# Patient Record
Sex: Female | Born: 1950 | Race: Black or African American | Hispanic: No | Marital: Married | Smoking: Never smoker
Health system: Southern US, Community
[De-identification: ages and names within clinical notes are randomized; demographics above are authoritative.]

## PROBLEM LIST (undated history)

## (undated) DIAGNOSIS — E669 Obesity, unspecified: Secondary | ICD-10-CM

## (undated) DIAGNOSIS — M170 Bilateral primary osteoarthritis of knee: Secondary | ICD-10-CM

## (undated) DIAGNOSIS — E785 Hyperlipidemia, unspecified: Secondary | ICD-10-CM

## (undated) DIAGNOSIS — Z114 Encounter for screening for human immunodeficiency virus [HIV]: Secondary | ICD-10-CM

## (undated) HISTORY — DX: Encounter for screening for human immunodeficiency virus (HIV): Z11.4

## (undated) HISTORY — PX: BUNIONECTOMY: SHX129

## (undated) HISTORY — PX: ABDOMINAL HYSTERECTOMY: SHX81

## (undated) HISTORY — DX: Obesity, unspecified: E66.9

## (undated) HISTORY — DX: Hyperlipidemia, unspecified: E78.5

## (undated) HISTORY — DX: Bilateral primary osteoarthritis of knee: M17.0

---

## 2003-11-11 ENCOUNTER — Ambulatory Visit: Payer: Self-pay | Admitting: Podiatry

## 2004-11-14 ENCOUNTER — Ambulatory Visit: Payer: Self-pay | Admitting: Family Medicine

## 2005-12-11 ENCOUNTER — Ambulatory Visit: Payer: Self-pay | Admitting: Family Medicine

## 2006-01-24 ENCOUNTER — Ambulatory Visit: Payer: Self-pay | Admitting: Gastroenterology

## 2007-03-13 ENCOUNTER — Ambulatory Visit: Payer: Self-pay | Admitting: Family Medicine

## 2008-04-20 ENCOUNTER — Ambulatory Visit: Payer: Self-pay | Admitting: Physician Assistant

## 2009-06-08 ENCOUNTER — Ambulatory Visit: Payer: Self-pay | Admitting: Family Medicine

## 2009-07-12 ENCOUNTER — Ambulatory Visit: Payer: Self-pay | Admitting: Family Medicine

## 2010-06-14 ENCOUNTER — Ambulatory Visit: Payer: Self-pay | Admitting: Family Medicine

## 2011-07-03 ENCOUNTER — Ambulatory Visit: Payer: Self-pay | Admitting: Family Medicine

## 2012-01-31 ENCOUNTER — Ambulatory Visit: Payer: Self-pay | Admitting: Family Medicine

## 2012-08-25 ENCOUNTER — Ambulatory Visit: Payer: Self-pay | Admitting: Family Medicine

## 2013-10-16 ENCOUNTER — Ambulatory Visit: Payer: Self-pay | Admitting: Family Medicine

## 2013-11-10 ENCOUNTER — Ambulatory Visit: Payer: Self-pay | Admitting: Family Medicine

## 2013-11-22 ENCOUNTER — Ambulatory Visit: Payer: Self-pay | Admitting: Family Medicine

## 2013-11-27 ENCOUNTER — Ambulatory Visit: Payer: Self-pay | Admitting: Gastroenterology

## 2014-10-19 ENCOUNTER — Encounter: Payer: Self-pay | Admitting: Family Medicine

## 2014-10-19 ENCOUNTER — Ambulatory Visit (INDEPENDENT_AMBULATORY_CARE_PROVIDER_SITE_OTHER): Payer: BLUE CROSS/BLUE SHIELD | Admitting: Family Medicine

## 2014-10-19 VITALS — BP 112/68 | HR 86 | Temp 98.2°F | Resp 16 | Ht 63.0 in | Wt 169.1 lb

## 2014-10-19 DIAGNOSIS — Z23 Encounter for immunization: Secondary | ICD-10-CM | POA: Diagnosis not present

## 2014-10-19 DIAGNOSIS — Z0000 Encounter for general adult medical examination without abnormal findings: Secondary | ICD-10-CM | POA: Diagnosis not present

## 2014-10-19 NOTE — Addendum Note (Signed)
Addended by: JEFFRIES, Ulla Potash on: 10/19/2014 10:47 AM   Modules accepted: Orders

## 2014-10-19 NOTE — Progress Notes (Signed)
Name: Desiree Carpenter   MRN: [id number]    DOB: 1950/02/14   Date:10/19/2014       Progress Note  Subjective  Chief Complaint  Chief Complaint  Patient presents with  . Annual Exam    HPI  Patient here for annual H&P with no new problems or complaints of significance  History reviewed. No pertinent past medical history.  Social History  Substance Use Topics  . Smoking status: Never Smoker   . Smokeless tobacco: Not on file  . Alcohol Use: No     Current outpatient prescriptions:  .  ibuprofen (ADVIL,MOTRIN) 800 MG tablet, Take 800 mg by mouth 3 (three) times daily., Disp: , Rfl:   No Known Allergies  Review of Systems  Constitutional: Negative for fever, chills and weight loss.       Mild obesity.      HENT: Negative for congestion, hearing loss, sore throat and tinnitus.   Eyes: Negative for blurred vision, double vision and redness.  Respiratory: Negative for cough, hemoptysis and shortness of breath.   Cardiovascular: Negative for chest pain, palpitations, orthopnea, claudication and leg swelling.  Gastrointestinal: Negative for heartburn, nausea, vomiting, diarrhea, constipation and blood in stool.  Genitourinary: Negative for dysuria, urgency, frequency and hematuria.  Musculoskeletal: Negative for myalgias, back pain, joint pain, falls and neck pain.  Skin: Negative for itching.  Neurological: Negative for dizziness, tingling, tremors, focal weakness, seizures, loss of consciousness, weakness and headaches.  Endo/Heme/Allergies: Does not bruise/bleed easily.  Psychiatric/Behavioral: Negative for depression and substance abuse. The patient is not nervous/anxious and does not have insomnia.      Objective  Filed Vitals:   10/19/14 0932  BP: 112/68  Pulse: 86  Temp: 98.2 F (36.8 C)  TempSrc: Oral  Resp: 16  Height: 5\' 3"  (1.6 m)  Weight: 169 lb 1.6 oz (76.703 kg)  SpO2: 95%     Physical Exam  Constitutional: She is oriented to person, place,  and time and well-developed, well-nourished, and in no distress.  Modestly obese  HENT:  Head: Normocephalic.  Eyes: EOM are normal. Pupils are equal, round, and reactive to light.  Neck: Normal range of motion. No thyromegaly present.  Cardiovascular: Normal rate, regular rhythm and normal heart sounds.   No murmur heard. Pulmonary/Chest: Effort normal and breath sounds normal.  Abdominal: Soft. Bowel sounds are normal.  Genitourinary: Vagina normal and left adnexa normal. Guaiac negative stool.  Uterus surgically absent  Musculoskeletal: Normal range of motion. She exhibits no edema.  Neurological: She is alert and oriented to person, place, and time. No cranial nerve deficit. Gait normal.  Skin: Skin is warm and dry. No rash noted.  Psychiatric: Memory and affect normal.      Assessment & Plan  1. Routine medical exam  - POC Hemoccult Bld/Stl (1-Cd Office Dx) - Basic metabolic panel - CBC with Differential - Comprehensive metabolic panel - Lipid panel - TSH

## 2014-11-30 LAB — CBC WITH DIFFERENTIAL/PLATELET
BASOS ABS: 0 10*3/uL (ref 0.0–0.2)
Basos: 0 %
EOS (ABSOLUTE): 0.1 10*3/uL (ref 0.0–0.4)
Eos: 2 %
HEMOGLOBIN: 13.2 g/dL (ref 11.1–15.9)
Hematocrit: 41.2 % (ref 34.0–46.6)
Immature Grans (Abs): 0 10*3/uL (ref 0.0–0.1)
Immature Granulocytes: 0 %
LYMPHS ABS: 1.1 10*3/uL (ref 0.7–3.1)
LYMPHS: 31 %
MCH: 27.5 pg (ref 26.6–33.0)
MCHC: 32 g/dL (ref 31.5–35.7)
MCV: 86 fL (ref 79–97)
MONOCYTES: 9 %
Monocytes Absolute: 0.3 10*3/uL (ref 0.1–0.9)
Neutrophils Absolute: 2.2 10*3/uL (ref 1.4–7.0)
Neutrophils: 58 %
PLATELETS: 201 10*3/uL (ref 150–379)
RBC: 4.8 x10E6/uL (ref 3.77–5.28)
RDW: 13.3 % (ref 12.3–15.4)
WBC: 3.7 10*3/uL (ref 3.4–10.8)

## 2014-11-30 LAB — COMPREHENSIVE METABOLIC PANEL
ALBUMIN: 4.4 g/dL (ref 3.6–4.8)
ALK PHOS: 60 IU/L (ref 39–117)
ALT: 13 IU/L (ref 0–32)
AST: 20 IU/L (ref 0–40)
Albumin/Globulin Ratio: 1.8 (ref 1.1–2.5)
BUN / CREAT RATIO: 20 (ref 11–26)
BUN: 17 mg/dL (ref 8–27)
Bilirubin Total: 0.4 mg/dL (ref 0.0–1.2)
CHLORIDE: 103 mmol/L (ref 97–106)
CO2: 23 mmol/L (ref 18–29)
Calcium: 9.7 mg/dL (ref 8.7–10.3)
Creatinine, Ser: 0.84 mg/dL (ref 0.57–1.00)
GFR calc Af Amer: 86 mL/min/{1.73_m2} (ref >59)
GFR calc non Af Amer: 74 mL/min/{1.73_m2} (ref >59)
GLUCOSE: 88 mg/dL (ref 65–99)
Globulin, Total: 2.5 g/dL (ref 1.5–4.5)
POTASSIUM: 4.9 mmol/L (ref 3.5–5.2)
SODIUM: 142 mmol/L (ref 136–144)
Total Protein: 6.9 g/dL (ref 6.0–8.5)

## 2014-11-30 LAB — LIPID PANEL
CHOLESTEROL TOTAL: 199 mg/dL (ref 100–199)
Chol/HDL Ratio: 2.5 ratio units (ref 0.0–4.4)
HDL: 80 mg/dL (ref >39)
LDL CALC: 107 mg/dL — AB (ref 0–99)
TRIGLYCERIDES: 60 mg/dL (ref 0–149)
VLDL Cholesterol Cal: 12 mg/dL (ref 5–40)

## 2014-11-30 LAB — TSH: TSH: 1.77 u[IU]/mL (ref 0.450–4.500)

## 2015-01-01 ENCOUNTER — Other Ambulatory Visit: Payer: Self-pay | Admitting: Family Medicine

## 2015-04-19 ENCOUNTER — Ambulatory Visit: Payer: BLUE CROSS/BLUE SHIELD | Admitting: Family Medicine

## 2016-03-10 LAB — HEMOGLOBIN A1C: Hemoglobin A1C: 5.5

## 2016-03-26 ENCOUNTER — Encounter: Payer: BLUE CROSS/BLUE SHIELD | Admitting: Family Medicine

## 2016-03-28 ENCOUNTER — Encounter: Payer: Self-pay | Admitting: Family Medicine

## 2016-03-28 ENCOUNTER — Ambulatory Visit (INDEPENDENT_AMBULATORY_CARE_PROVIDER_SITE_OTHER): Payer: BLUE CROSS/BLUE SHIELD | Admitting: Family Medicine

## 2016-03-28 VITALS — BP 122/68 | HR 69 | Temp 98.3°F | Resp 14 | Ht 62.0 in | Wt 178.6 lb

## 2016-03-28 DIAGNOSIS — E669 Obesity, unspecified: Secondary | ICD-10-CM | POA: Insufficient documentation

## 2016-03-28 DIAGNOSIS — Z1159 Encounter for screening for other viral diseases: Secondary | ICD-10-CM

## 2016-03-28 DIAGNOSIS — Z0000 Encounter for general adult medical examination without abnormal findings: Secondary | ICD-10-CM

## 2016-03-28 DIAGNOSIS — Z803 Family history of malignant neoplasm of breast: Secondary | ICD-10-CM | POA: Diagnosis not present

## 2016-03-28 DIAGNOSIS — Z1231 Encounter for screening mammogram for malignant neoplasm of breast: Secondary | ICD-10-CM | POA: Diagnosis not present

## 2016-03-28 DIAGNOSIS — E6609 Other obesity due to excess calories: Secondary | ICD-10-CM

## 2016-03-28 DIAGNOSIS — Z114 Encounter for screening for human immunodeficiency virus [HIV]: Secondary | ICD-10-CM | POA: Diagnosis not present

## 2016-03-28 DIAGNOSIS — Z6832 Body mass index (BMI) 32.0-32.9, adult: Secondary | ICD-10-CM

## 2016-03-28 DIAGNOSIS — Z1239 Encounter for other screening for malignant neoplasm of breast: Secondary | ICD-10-CM

## 2016-03-28 HISTORY — DX: Obesity, unspecified: E66.9

## 2016-03-28 LAB — CBC WITH DIFFERENTIAL/PLATELET
BASOS ABS: 0 {cells}/uL (ref 0–200)
Basophils Relative: 0 %
EOS PCT: 1 %
Eosinophils Absolute: 47 cells/uL (ref 15–500)
HEMATOCRIT: 41.7 % (ref 35.0–45.0)
HEMOGLOBIN: 13.4 g/dL (ref 11.7–15.5)
LYMPHS ABS: 1645 {cells}/uL (ref 850–3900)
Lymphocytes Relative: 35 %
MCH: 27.6 pg (ref 27.0–33.0)
MCHC: 32.1 g/dL (ref 32.0–36.0)
MCV: 86 fL (ref 80.0–100.0)
MONO ABS: 329 {cells}/uL (ref 200–950)
MPV: 12.9 fL — ABNORMAL HIGH (ref 7.5–12.5)
Monocytes Relative: 7 %
NEUTROS PCT: 57 %
Neutro Abs: 2679 cells/uL (ref 1500–7800)
Platelets: 208 10*3/uL (ref 140–400)
RBC: 4.85 MIL/uL (ref 3.80–5.10)
RDW: 13.8 % (ref 11.0–15.0)
WBC: 4.7 10*3/uL (ref 3.8–10.8)

## 2016-03-28 LAB — COMPLETE METABOLIC PANEL WITH GFR
ALBUMIN: 4.5 g/dL (ref 3.6–5.1)
ALK PHOS: 58 U/L (ref 33–130)
ALT: 13 U/L (ref 6–29)
AST: 18 U/L (ref 10–35)
BUN: 15 mg/dL (ref 7–25)
CO2: 27 mmol/L (ref 20–31)
Calcium: 10.2 mg/dL (ref 8.6–10.4)
Chloride: 106 mmol/L (ref 98–110)
Creat: 0.83 mg/dL (ref 0.50–0.99)
GFR, Est African American: 86 mL/min (ref >=60)
GFR, Est Non African American: 75 mL/min (ref >=60)
GLUCOSE: 91 mg/dL (ref 65–99)
POTASSIUM: 4.5 mmol/L (ref 3.5–5.3)
SODIUM: 141 mmol/L (ref 135–146)
Total Bilirubin: 0.4 mg/dL (ref 0.2–1.2)
Total Protein: 7.4 g/dL (ref 6.1–8.1)

## 2016-03-28 NOTE — Assessment & Plan Note (Signed)
Encouraged modest weight loss 

## 2016-03-28 NOTE — Assessment & Plan Note (Signed)
Discussed one-time HIV screening recommendation per USPSTF guidelines; patient agrees with testing; HIV antibody ordered 

## 2016-03-28 NOTE — Assessment & Plan Note (Signed)
Mother and MGM; refer to breast cancer navigator

## 2016-03-28 NOTE — Assessment & Plan Note (Signed)
Discussed one-time hep C screening recommendation for individuals born between 1945-1965 per USPSTF guidelines; patient agrees with testing; Hep C Ab ordered 

## 2016-03-28 NOTE — Patient Instructions (Addendum)
I'll suggest 1,000 iu of vitamin D3 daily I'll suggest a baby 81 mg coated aspirin daily for cardioprotection Recheck fasting labs in 3 months and we'll contact you about the results Check out the information at familydoctor.org entitled "Nutrition for Weight Loss: What You Need to Know about Fad Diets" Try to lose between 1-2 pounds per week by taking in fewer calories and burning off more calories You can succeed by limiting portions, limiting foods dense in calories and fat, becoming more active, and drinking 8 glasses of water a day (64 ounces) Don't skip meals, especially breakfast, as skipping meals may alter your metabolism Do not use over-the-counter weight loss pills or gimmicks that claim rapid weight loss A healthy BMI (or body mass index) is between 18.5 and 24.9 You can calculate your ideal BMI at the NIH website ClubMonetize.fr Try to limit saturated fats in your diet (bologna, hot dogs, barbeque, cheeseburgers, hamburgers, steak, bacon, sausage, cheese, etc.) and get more fresh fruits, vegetables, and whole grains Return for any problems that you have Health Maintenance, Female Adopting a healthy lifestyle and getting preventive care can go a long way to promote health and wellness. Talk with your health care provider about what schedule of regular examinations is right for you. This is a good chance for you to check in with your provider about disease prevention and staying healthy. In between checkups, there are plenty of things you can do on your own. Experts have done a lot of research about which lifestyle changes and preventive measures are most likely to keep you healthy. Ask your health care provider for more information. Weight and diet Eat a healthy diet  Be sure to include plenty of vegetables, fruits, low-fat dairy products, and lean protein.  Do not eat a lot of foods high in solid fats, added sugars, or salt.  Get  regular exercise. This is one of the most important things you can do for your health.  Most adults should exercise for at least 150 minutes each week. The exercise should increase your heart rate and make you sweat (moderate-intensity exercise).  Most adults should also do strengthening exercises at least twice a week. This is in addition to the moderate-intensity exercise. Maintain a healthy weight  Body mass index (BMI) is a measurement that can be used to identify possible weight problems. It estimates body fat based on height and weight. Your health care provider can help determine your BMI and help you achieve or maintain a healthy weight.  For females 42 years of age and older:  A BMI below 18.5 is considered underweight.  A BMI of 18.5 to 24.9 is normal.  A BMI of 25 to 29.9 is considered overweight.  A BMI of 30 and above is considered obese. Watch levels of cholesterol and blood lipids  You should start having your blood tested for lipids and cholesterol at 65 years of age, then have this test every 5 years.  You may need to have your cholesterol levels checked more often if:  Your lipid or cholesterol levels are high.  You are older than 65 years of age.  You are at high risk for heart disease. Cancer screening Lung Cancer  Lung cancer screening is recommended for adults 42-26 years old who are at high risk for lung cancer because of a history of smoking.  A yearly low-dose CT scan of the lungs is recommended for people who:  Currently smoke.  Have quit within the past 15 years.  Have at least a 30-pack-year history of smoking. A pack year is smoking an average of one pack of cigarettes a day for 1 year.  Yearly screening should continue until it has been 15 years since you quit.  Yearly screening should stop if you develop a health problem that would prevent you from having lung cancer treatment. Breast Cancer  Practice breast self-awareness. This means  understanding how your breasts normally appear and feel.  It also means doing regular breast self-exams. Let your health care provider know about any changes, no matter how small.  If you are in your 20s or 30s, you should have a clinical breast exam (CBE) by a health care provider every 1-3 years as part of a regular health exam.  If you are 84 or older, have a CBE every year. Also consider having a breast X-ray (mammogram) every year.  If you have a family history of breast cancer, talk to your health care provider about genetic screening.  If you are at high risk for breast cancer, talk to your health care provider about having an MRI and a mammogram every year.  Breast cancer gene (BRCA) assessment is recommended for women who have family members with BRCA-related cancers. BRCA-related cancers include:  Breast.  Ovarian.  Tubal.  Peritoneal cancers.  Results of the assessment will determine the need for genetic counseling and BRCA1 and BRCA2 testing. Cervical Cancer  Your health care provider may recommend that you be screened regularly for cancer of the pelvic organs (ovaries, uterus, and vagina). This screening involves a pelvic examination, including checking for microscopic changes to the surface of your cervix (Pap test). You may be encouraged to have this screening done every 3 years, beginning at age 42.  For women ages 65-65, health care providers may recommend pelvic exams and Pap testing every 3 years, or they may recommend the Pap and pelvic exam, combined with testing for human papilloma virus (HPV), every 5 years. Some types of HPV increase your risk of cervical cancer. Testing for HPV may also be done on women of any age with unclear Pap test results.  Other health care providers may not recommend any screening for nonpregnant women who are considered low risk for pelvic cancer and who do not have symptoms. Ask your health care provider if a screening pelvic exam is  right for you.  If you have had past treatment for cervical cancer or a condition that could lead to cancer, you need Pap tests and screening for cancer for at least 20 years after your treatment. If Pap tests have been discontinued, your risk factors (such as having a new sexual partner) need to be reassessed to determine if screening should resume. Some women have medical problems that increase the chance of getting cervical cancer. In these cases, your health care provider may recommend more frequent screening and Pap tests. Colorectal Cancer  This type of cancer can be detected and often prevented.  Routine colorectal cancer screening usually begins at 65 years of age and continues through 65 years of age.  Your health care provider may recommend screening at an earlier age if you have risk factors for colon cancer.  Your health care provider may also recommend using home test kits to check for hidden blood in the stool.  A small camera at the end of a tube can be used to examine your colon directly (sigmoidoscopy or colonoscopy). This is done to check for the earliest forms of colorectal cancer.  Routine screening usually begins at age 85.  Direct examination of the colon should be repeated every 5-10 years through 65 years of age. However, you may need to be screened more often if early forms of precancerous polyps or small growths are found. Skin Cancer  Check your skin from head to toe regularly.  Tell your health care provider about any new moles or changes in moles, especially if there is a change in a mole's shape or color.  Also tell your health care provider if you have a mole that is larger than the size of a pencil eraser.  Always use sunscreen. Apply sunscreen liberally and repeatedly throughout the day.  Protect yourself by wearing long sleeves, pants, a wide-brimmed hat, and sunglasses whenever you are outside. Heart disease, diabetes, and high blood pressure  High  blood pressure causes heart disease and increases the risk of stroke. High blood pressure is more likely to develop in:  People who have blood pressure in the high end of the normal range (130-139/85-89 mm Hg).  People who are overweight or obese.  People who are African American.  If you are 24-67 years of age, have your blood pressure checked every 3-5 years. If you are 53 years of age or older, have your blood pressure checked every year. You should have your blood pressure measured twice-once when you are at a hospital or clinic, and once when you are not at a hospital or clinic. Record the average of the two measurements. To check your blood pressure when you are not at a hospital or clinic, you can use:  An automated blood pressure machine at a pharmacy.  A home blood pressure monitor.  If you are between 79 years and 50 years old, ask your health care provider if you should take aspirin to prevent strokes.  Have regular diabetes screenings. This involves taking a blood sample to check your fasting blood sugar level.  If you are at a normal weight and have a low risk for diabetes, have this test once every three years after 65 years of age.  If you are overweight and have a high risk for diabetes, consider being tested at a younger age or more often. Preventing infection Hepatitis B  If you have a higher risk for hepatitis B, you should be screened for this virus. You are considered at high risk for hepatitis B if:  You were born in a country where hepatitis B is common. Ask your health care provider which countries are considered high risk.  Your parents were born in a high-risk country, and you have not been immunized against hepatitis B (hepatitis B vaccine).  You have HIV or AIDS.  You use needles to inject street drugs.  You live with someone who has hepatitis B.  You have had sex with someone who has hepatitis B.  You get hemodialysis treatment.  You take certain  medicines for conditions, including cancer, organ transplantation, and autoimmune conditions. Hepatitis C  Blood testing is recommended for:  Everyone born from [birth year] through 1964.  Anyone with known risk factors for hepatitis C. Sexually transmitted infections (STIs)  You should be screened for sexually transmitted infections (STIs) including gonorrhea and chlamydia if:  You are sexually active and are younger than 65 years of age.  You are older than 66 years of age and your health care provider tells you that you are at risk for this type of infection.  Your sexual activity has changed since you  were last screened and you are at an increased risk for chlamydia or gonorrhea. Ask your health care provider if you are at risk.  If you do not have HIV, but are at risk, it may be recommended that you take a prescription medicine daily to prevent HIV infection. This is called pre-exposure prophylaxis (PrEP). You are considered at risk if:  You are sexually active and do not regularly use condoms or know the HIV status of your partner(s).  You take drugs by injection.  You are sexually active with a partner who has HIV. Talk with your health care provider about whether you are at high risk of being infected with HIV. If you choose to begin PrEP, you should first be tested for HIV. You should then be tested every 3 months for as long as you are taking PrEP. Pregnancy  If you are premenopausal and you may become pregnant, ask your health care provider about preconception counseling.  If you may become pregnant, take 400 to 800 micrograms (mcg) of folic acid every day.  If you want to prevent pregnancy, talk to your health care provider about birth control (contraception). Osteoporosis and menopause  Osteoporosis is a disease in which the bones lose minerals and strength with aging. This can result in serious bone fractures. Your risk for osteoporosis can be identified using a bone density  scan.  If you are 51 years of age or older, or if you are at risk for osteoporosis and fractures, ask your health care provider if you should be screened.  Ask your health care provider whether you should take a calcium or vitamin D supplement to lower your risk for osteoporosis.  Menopause may have certain physical symptoms and risks.  Hormone replacement therapy may reduce some of these symptoms and risks. Talk to your health care provider about whether hormone replacement therapy is right for you. Follow these instructions at home:  Schedule regular health, dental, and eye exams.  Stay current with your immunizations.  Do not use any tobacco products including cigarettes, chewing tobacco, or electronic cigarettes.  If you are pregnant, do not drink alcohol.  If you are breastfeeding, limit how much and how often you drink alcohol.  Limit alcohol intake to no more than 1 drink per day for nonpregnant women. One drink equals 12 ounces of beer, 5 ounces of wine, or 1 ounces of hard liquor.  Do not use street drugs.  Do not share needles.  Ask your health care provider for help if you need support or information about quitting drugs.  Tell your health care provider if you often feel depressed.  Tell your health care provider if you have ever been abused or do not feel safe at home. This information is not intended to replace advice given to you by your health care provider. Make sure you discuss any questions you have with your health care provider. Document Released: 07/24/2010 Document Revised: 06/16/2015 Document Reviewed: 10/12/2014 Elsevier Interactive Patient Education  2017 [employer].

## 2016-03-28 NOTE — Progress Notes (Signed)
Patient ID: Desiree Carpenter, female   DOB: [birth year], 66 y.o.   MRN: [id number]   Subjective:   Desiree Carpenter is a 66 y.o. female here for a complete physical exam  Interim issues since last visit: nothing; light-headed or dizziness that comes and goes; bring her back for that, she would rather do the physical today  USPSTF grade A and B recommendations Alcohol: no Depression: Depression screen [facility] 2/9 03/28/2016 10/19/2014  Decreased Interest 0 0  Down, Depressed, Hopeless 0 0  PHQ - 2 Score 0 0    Hypertension: excellent control; no meds Obesity: discussed; not walking as much as she should; she'll work on that Tobacco use: never HIV, hep B, hep C: discussed, encouraged Lipids: reviewed Glucose: reveiwed, 108 nonfasting, A1c 5.5 Colorectal cancer: 2013; next due this year; no blood in the stool; no night sweats or weight loss Breast cancer: no prior abnormals; mother (survivor) and maternal GM both had breast cancer BRCA gene screening: no ovarian cancer Intimate partner violence: no abuse Cervical cancer screening: s/p hysterectomy; fibroids, no cancer Lung cancer: never smoker Osteoporosis: it's been several years, but not brittle; start at 6 Fall prevention/vitamin D: discussed AAA: no fam hx Aspirin: 6% risk Diet: watching fatty meats; dark greens daily; calcium  Exercise: build gradually Skin cancer: no worrisome moles, nothing changing  Past Medical History:  Diagnosis Date  . Obesity 03/28/2016     Past Surgical History:  Procedure Laterality Date  . ABDOMINAL HYSTERECTOMY    . BUNIONECTOMY     Family History  Problem Relation Age of Onset  . Cancer Mother     breast (50), colon (80)  . Cancer Father 49    colon  . Cancer Maternal Grandmother     breast  . Heart disease Maternal Grandfather     heart failure  . Cancer Maternal Grandfather     lung  . Stroke Paternal Grandmother    Social History  Substance Use Topics  . Smoking status:  Never Smoker  . Smokeless tobacco: Former Neurosurgeon  . Alcohol use No   Review of Systems  Constitutional: Negative for unexpected weight change.  HENT: Negative for hearing loss.   Eyes: Negative for visual disturbance.  Cardiovascular: Negative for chest pain.  Gastrointestinal: Negative for blood in stool.  Endocrine: Negative for polydipsia and polyuria.  Genitourinary: Negative for hematuria.  Musculoskeletal: Positive for arthralgias (hands).  Allergic/Immunologic: Negative for food allergies.  Neurological: Negative for tremors.  Hematological: Does not bruise/bleed easily.  Psychiatric/Behavioral: Negative for dysphoric mood.    Objective:   Vitals:   03/28/16 1147  BP: 122/68  Pulse: 69  Resp: 14  Temp: 98.3 F (36.8 C)  TempSrc: Oral  SpO2: 97%  Weight: 178 lb 9.6 oz (81 kg)  Height: 5\' 2"  (1.575 m)   Body mass index is 32.67 kg/m. Wt Readings from Last 3 Encounters:  03/28/16 178 lb 9.6 oz (81 kg)  10/19/14 169 lb 1.6 oz (76.7 kg)   Physical Exam  Constitutional: She appears well-developed and well-nourished.  HENT:  Head: Normocephalic and atraumatic.  Right Ear: Hearing, tympanic membrane, external ear and ear canal normal.  Left Ear: Hearing, tympanic membrane, external ear and ear canal normal.  Eyes: Conjunctivae and EOM are normal. Right eye exhibits no hordeolum. Left eye exhibits no hordeolum. No scleral icterus.  Neck: No JVD present. No thyromegaly present.  Cardiovascular: Normal rate, regular rhythm, S1 normal, S2 normal and normal heart sounds.   No  extrasystoles are present.  Pulmonary/Chest: Effort normal and breath sounds normal. No respiratory distress. Right breast exhibits no inverted nipple, no mass, no nipple discharge, no skin change and no tenderness. Left breast exhibits no inverted nipple, no mass, no nipple discharge, no skin change and no tenderness. Breasts are symmetrical.  Abdominal: Soft. Normal appearance and bowel sounds are  normal. She exhibits no distension, no abdominal bruit, no pulsatile midline mass and no mass. There is no hepatosplenomegaly. There is no tenderness. No hernia.  Musculoskeletal: Normal range of motion. She exhibits no edema.  Lymphadenopathy:       Head (right side): No submandibular adenopathy present.       Head (left side): No submandibular adenopathy present.    She has no cervical adenopathy.    She has no axillary adenopathy.  Neurological: She is alert. She displays no tremor. No cranial nerve deficit. She exhibits normal muscle tone. Gait normal.  Reflex Scores:      Patellar reflexes are 1+ on the right side and 1+ on the left side. Skin: Skin is warm and dry. No bruising and no ecchymosis noted. No cyanosis. No pallor.  Psychiatric: She has a normal mood and affect. Her speech is normal and behavior is normal. Thought content normal. Her mood appears not anxious. She does not exhibit a depressed mood.   Assessment/Plan:   Problem List Items Addressed This Visit      Other   Preventative health care - Primary    USPSTF grade A and B recommendations reviewed with patient; age-appropriate recommendations, preventive care, screening tests, etc discussed and encouraged; healthy living encouraged; see AVS for patient education given to patient       Relevant Orders   COMPLETE METABOLIC PANEL WITH GFR   CBC with Differential/Platelet   Obesity    Encouraged modest weight loss      Family hx-breast malignancy    Mother and MGM; refer to breast cancer navigator      Relevant Orders   AMB referral to Navigator   Encounter for screening for HIV    Discussed one-time HIV screening recommendation per USPSTF guidelines; patient agrees with testing; HIV antibody ordered       Relevant Orders   HIV antibody   Encounter for hepatitis C screening test for low risk patient    Discussed one-time hep C screening recommendation for individuals born between 1945-1965 per USPSTF  guidelines; patient agrees with testing; Hep C Ab ordered       Relevant Orders   Hepatitis C Antibody    Other Visit Diagnoses    Screening for breast cancer       Relevant Orders   MM Digital Screening      No orders of the defined types were placed in this encounter.  Orders Placed This Encounter  Procedures  . MM Digital Screening    Standing Status:   Future    Standing Expiration Date:   05/28/2017    Order Specific Question:   Reason for Exam (SYMPTOM  OR DIAGNOSIS REQUIRED)    Answer:   screening for breast cancer    Order Specific Question:   Preferred imaging location?    Answer:   [facility]  . HIV antibody  . Hepatitis C Antibody  . COMPLETE METABOLIC PANEL WITH GFR  . CBC with Differential/Platelet  . AMB referral to Navigator    Follow up plan: Return in about 8 months (around 12/03/2016) for Welcome to Medicare visit, 40 minutes.  An After Visit Summary was printed and given to the patient.

## 2016-03-28 NOTE — Assessment & Plan Note (Signed)
USPSTF grade A and B recommendations reviewed with patient; age-appropriate recommendations, preventive care, screening tests, etc discussed and encouraged; healthy living encouraged; see AVS for patient education given to patient  

## 2016-03-29 LAB — HIV ANTIBODY (ROUTINE TESTING W REFLEX): HIV 1&2 Ab, 4th Generation: NONREACTIVE

## 2016-03-29 LAB — HEPATITIS C ANTIBODY: HCV Ab: NEGATIVE

## 2016-04-03 ENCOUNTER — Ambulatory Visit
Admission: RE | Admit: 2016-04-03 | Discharge: 2016-04-03 | Disposition: A | Discharge status: Home / Self Care | Payer: BLUE CROSS/BLUE SHIELD | Source: Ambulatory Visit | Attending: Family Medicine | Admitting: Family Medicine

## 2016-04-03 DIAGNOSIS — Z1239 Encounter for other screening for malignant neoplasm of breast: Secondary | ICD-10-CM

## 2016-04-03 DIAGNOSIS — Z1231 Encounter for screening mammogram for malignant neoplasm of breast: Secondary | ICD-10-CM | POA: Insufficient documentation

## 2016-04-10 ENCOUNTER — Telehealth: Payer: Self-pay | Admitting: Family Medicine

## 2016-04-10 NOTE — Telephone Encounter (Signed)
-----  Message from Theodore Demark, RN sent at 04/10/2016  4:24 PM EDT ----- Regarding: BRCA testing Hi Dr. Sanda Klein, I received your referral about BRCA testing for Ms. Bordenave.  Currently we do not have [employer] here at the [facility], but I talked to [employer] who works for [employer]. Crystal performed genetic testing here in the past.  She said if you would like, she could come by to discuss some options for this patient.  She is very knowledgeable, and helpful with questions related to testing.  Her number is [phone].   I will fax her card, and the NCCN 2018 guidelines for BRCA to you. Thank you, Al Pimple Nurse Navigator

## 2016-04-10 NOTE — Telephone Encounter (Signed)
Can you please pass along this info? Thank you

## 2016-04-11 NOTE — Telephone Encounter (Signed)
Pt informed

## 2016-08-24 ENCOUNTER — Ambulatory Visit (INDEPENDENT_AMBULATORY_CARE_PROVIDER_SITE_OTHER): Payer: BLUE CROSS/BLUE SHIELD | Admitting: Family Medicine

## 2016-08-24 ENCOUNTER — Encounter: Payer: Self-pay | Admitting: Family Medicine

## 2016-08-24 VITALS — BP 118/64 | HR 88 | Temp 98.4°F | Resp 16 | Wt 178.8 lb

## 2016-08-24 DIAGNOSIS — N6321 Unspecified lump in the left breast, upper outer quadrant: Secondary | ICD-10-CM

## 2016-08-24 DIAGNOSIS — N644 Mastodynia: Secondary | ICD-10-CM

## 2016-08-24 NOTE — Progress Notes (Signed)
BP 118/64   Pulse 88   Temp 98.4 F (36.9 C) (Oral)   Resp 16   Wt 178 lb 12.8 oz (81.1 kg)   SpO2 94%   BMI 32.70 kg/m    Subjective:    Patient ID: Desiree Carpenter, female    DOB: Mar 31, 1950, 66 y.o.   MRN: [id number]  HPI: Desiree Carpenter is a 66 y.o. female  Chief Complaint  Patient presents with  . Breast Problem    Left breast numbness and no pain happening for 2-3 weeks    HPI Patient is here for an acute visit She has felt numbness in her LEFT breast for 2-3 weeks No nipple discharge Positive family hx of breast cancer (mother and maternal grandmother) No other symptoms Just concerned and wanted to have her breast checked out  Depression screen [facility] 2/9 08/24/2016 03/28/2016 10/19/2014  Decreased Interest 0 0 0  Down, Depressed, Hopeless 0 0 0  PHQ - 2 Score 0 0 0    Relevant past medical, surgical, family and social history reviewed Past Medical History:  Diagnosis Date  . Obesity 03/28/2016   Past Surgical History:  Procedure Laterality Date  . ABDOMINAL HYSTERECTOMY    . BUNIONECTOMY     Family History  Problem Relation Age of Onset  . Cancer Mother        breast (66), colon (28)  . Breast cancer Mother 76  . Cancer Father 28       colon  . Cancer Maternal Grandmother        breast  . Breast cancer Maternal Grandmother 63  . Heart disease Maternal Grandfather        heart failure  . Cancer Maternal Grandfather        lung  . Stroke Paternal Grandmother    Social History   Social History  . Marital status: Married    Spouse name: N/A  . Number of children: N/A  . Years of education: N/A   Occupational History  . Not on file.   Social History Main Topics  . Smoking status: Never Smoker  . Smokeless tobacco: Never Used  . Alcohol use No  . Drug use: No  . Sexual activity: Not Currently   Other Topics Concern  . Not on file   Social History Narrative  . No narrative on file    Interim medical history since last visit  reviewed. Allergies and medications reviewed  Review of Systems Per HPI unless specifically indicated above     Objective:    BP 118/64   Pulse 88   Temp 98.4 F (36.9 C) (Oral)   Resp 16   Wt 178 lb 12.8 oz (81.1 kg)   SpO2 94%   BMI 32.70 kg/m   Wt Readings from Last 3 Encounters:  08/24/16 178 lb 12.8 oz (81.1 kg)  03/28/16 178 lb 9.6 oz (81 kg)  10/19/14 169 lb 1.6 oz (76.7 kg)    Physical Exam  Constitutional: She appears well-developed and well-nourished.  Cardiovascular: Normal rate.   Pulmonary/Chest: Effort normal. Right breast exhibits no inverted nipple, no mass, no nipple discharge, no skin change and no tenderness. Left breast exhibits mass (fullness in upper outer quadrant LEFT breast). Left breast exhibits no inverted nipple, no nipple discharge, no skin change and no tenderness. Breasts are symmetrical.  Lymphadenopathy:    She has no axillary adenopathy.  Skin:  No skin changes over the breasts  Psychiatric: Her mood appears not anxious.  Results for orders placed or performed in visit on 03/28/16  Hemoglobin A1c  Result Value Ref Range   Hemoglobin A1C 5.5       Assessment & Plan:   Problem List Items Addressed This Visit    None    Visit Diagnoses    Breast pain, left    -  Primary   Relevant Orders   US BREAST LTD UNI LEFT INC AXILLA (Completed)   MM DIAG BREAST TOMO UNI LEFT (Completed)   Breast lump on left side at 1 o'clock position       Relevant Orders   US BREAST LTD UNI LEFT INC AXILLA (Completed)   MM DIAG BREAST TOMO UNI LEFT (Completed)       Follow up plan: No Follow-up on file.  An after-visit summary was printed and given to the patient at [facility].  Please see the patient instructions which may contain other information and recommendations beyond what is mentioned above in the assessment and plan.  Meds ordered this encounter  Medications  . Multiple Vitamin (MULTIVITAMIN) tablet    Sig: Take 1 tablet by mouth  daily.  . cholecalciferol (VITAMIN D) 1000 units tablet    Sig: Take 1,000 Units by mouth once a week.    Orders Placed This Encounter  Procedures  . US BREAST LTD UNI LEFT INC AXILLA  . MM DIAG BREAST TOMO UNI LEFT

## 2016-08-24 NOTE — Patient Instructions (Addendum)
We'll get your breast imaging scheduled If you have not heard anything from my staff in a week about any orders/referrals/studies from today, please contact us here to follow-up [phone] BRCA Gene Testing Why am I having this test? BRCA gene testing is done to check for the presence of harmful changes (mutations) in the BRCA1 gene or the BRCA2 gene (breast cancer susceptibility genes). If there is a mutation, the genes may not be able to help repair damaged cells in the body. As a result, the damaged cells may develop defects that can lead to certain types of cancer. You may have this test if you have a family history of certain types of cancer, including cancer of the:  Breast.  Ovaries.  Fallopian tubes.  Peritoneum.  Pancreas.  Prostate.  What kind of sample is taken? The test requires either a sample of blood or a sample of cells from your saliva. If a sample of blood is needed, it will probably be collected by inserting a needle into a vein. If a sample of saliva is needed, you will get instructions about how to collect the sample. What do the results mean? The test results can show whether you have a mutation in the BRCA1 or BRCA2 gene that increases your risk for certain cancers. Meaning of negative test results A negative test result means that you do not have a mutation in the BRCA1 or BRCA2 gene that is known to increase your risk for certain cancers. This does not mean that you will never get cancer. Talk with your health care provider or a genetic counselor about what this result means for you. Meaning of positive test results A positive test result means that you have a mutation in the BRCA1 or BRCA2 gene that increases your risk for certain cancers. Women with a positive test result have an increased risk for breast and ovarian cancer. Both women and men with a mutation have an increased risk for breast cancer and may be at greater risk for other types of cancer. Getting  a positive test result does not mean that you will develop cancer. Talk with your health care provider or a genetic counselor about what this result means for you. You may be told that you are a carrier. This means that you can pass the mutation to your children. Meaning of ambiguous test results Ambiguous, inconclusive, or uncertain test results mean that there is a change in the BRCA1 or BRCA2 gene, but it is a change that has not been linked to cancer. Talk with your health care provider or a genetic counselor about what this result means for you. Talk with your health care provider to discuss your results, treatment options, and if necessary, the need for more tests. Talk with your health care provider if you have any questions about your results. How do I get my results? It is up to you to get your test results. Ask your health care provider, or the department that is doing the test, when your results will be ready. This information is not intended to replace advice given to you by your health care provider. Make sure you discuss any questions you have with your health care provider. Document Released: 02/02/2004 Document Revised: 09/12/2015 Document Reviewed: 08/31/2015 Elsevier Interactive Patient Education  [employer].

## 2016-08-30 ENCOUNTER — Ambulatory Visit
Admission: RE | Admit: 2016-08-30 | Discharge: 2016-08-30 | Disposition: A | Discharge status: Home / Self Care | Payer: BLUE CROSS/BLUE SHIELD | Source: Ambulatory Visit | Attending: Family Medicine | Admitting: Family Medicine

## 2016-08-30 DIAGNOSIS — N6321 Unspecified lump in the left breast, upper outer quadrant: Secondary | ICD-10-CM | POA: Diagnosis not present

## 2016-08-30 DIAGNOSIS — N644 Mastodynia: Secondary | ICD-10-CM

## 2016-09-03 ENCOUNTER — Ambulatory Visit (INDEPENDENT_AMBULATORY_CARE_PROVIDER_SITE_OTHER): Payer: BLUE CROSS/BLUE SHIELD

## 2016-09-03 DIAGNOSIS — N644 Mastodynia: Secondary | ICD-10-CM | POA: Diagnosis not present

## 2016-11-29 ENCOUNTER — Encounter: Payer: Self-pay | Admitting: Family Medicine

## 2016-11-29 ENCOUNTER — Ambulatory Visit (INDEPENDENT_AMBULATORY_CARE_PROVIDER_SITE_OTHER): Payer: PPO | Admitting: Family Medicine

## 2016-11-29 VITALS — BP 122/62 | HR 73 | Temp 98.1°F | Resp 14 | Ht 63.0 in | Wt 180.4 lb

## 2016-11-29 DIAGNOSIS — Z1211 Encounter for screening for malignant neoplasm of colon: Secondary | ICD-10-CM | POA: Diagnosis not present

## 2016-11-29 DIAGNOSIS — Z789 Other specified health status: Secondary | ICD-10-CM

## 2016-11-29 DIAGNOSIS — Z6831 Body mass index (BMI) 31.0-31.9, adult: Secondary | ICD-10-CM | POA: Diagnosis not present

## 2016-11-29 DIAGNOSIS — Z5181 Encounter for therapeutic drug level monitoring: Secondary | ICD-10-CM | POA: Diagnosis not present

## 2016-11-29 DIAGNOSIS — N951 Menopausal and female climacteric states: Secondary | ICD-10-CM | POA: Insufficient documentation

## 2016-11-29 DIAGNOSIS — Z780 Asymptomatic menopausal state: Secondary | ICD-10-CM | POA: Diagnosis not present

## 2016-11-29 DIAGNOSIS — Z0000 Encounter for general adult medical examination without abnormal findings: Secondary | ICD-10-CM

## 2016-11-29 DIAGNOSIS — E6609 Other obesity due to excess calories: Secondary | ICD-10-CM | POA: Diagnosis not present

## 2016-11-29 MED ORDER — MELOXICAM 15 MG PO TABS: 15.0000 mg | ORAL_TABLET | Freq: Every day | ORAL | 2 refills | Status: DC | PRN

## 2016-11-29 NOTE — Progress Notes (Signed)
Patient: Desiree Carpenter, Female    DOB: 1950/03/18, 66 y.o.   MRN: [id number]  Visit Date: 11/29/2016  Today's Provider: Enid Derry, MD   Chief Complaint  Patient presents with  . Medicare Wellness    welcome    Subjective:    HPI Desiree Carpenter is a 66 y.o. female who presents today for her Subsequent Annual Wellness Visit.  Patient/Caregiver input:  N/a No medical excitement  USPSTF grade A and B recommendations Depression:  Depression screen [facility] 2/9 11/29/2016 08/24/2016 03/28/2016 10/19/2014  Decreased Interest 0 0 0 0  Down, Depressed, Hopeless 0 0 0 0  PHQ - 2 Score 0 0 0 0   Hypertension: good control BP Readings from Last 3 Encounters:  11/29/16 122/62  08/24/16 118/64  03/28/16 122/68   Obesity: she would like to get to 150 pounds Wt Readings from Last 3 Encounters:  11/29/16 180 lb 6.4 oz (81.8 kg)  08/24/16 178 lb 12.8 oz (81.1 kg)  03/28/16 178 lb 9.6 oz (81 kg)   BMI Readings from Last 3 Encounters:  11/29/16 31.96 kg/m  08/24/16 32.70 kg/m  03/28/16 32.67 kg/m    Skin cancer: no worrisome lumps Lung cancer:  Never smoker Breast cancer: next mammo due March 2019 Colorectal cancer: last done Dec 2013; next due this Dec; every five years due to fam hx; no abd pain or bleeding  BRCA gene screening: family hx of breast and/or ovarian cancer and/or metastatic prostate cancer? Mother had breast; MGM breast cancer; no other fam members Cervical cancer screening: s/p hysterectomy for fibroids; no cancer HIV, hep B, hep C: hep C done March 2018 STD testing and prevention (chl/gon/syphilis): not interested Intimate partner violence: no abuse Contraception: n/a Osteoporosis: last DEXA done 2014; due now Fall prevention/vitamin D: discussed  Diet: limiting fatty meats Exercise: will increase gradually, doing that better Alcohol: no Tobacco use: never Aspirin: occasionally Lipids: not needed today Lab Results  Component Value Date   CHOL 199  11/29/2014   Lab Results  Component Value Date   HDL 80 11/29/2014   Lab Results  Component Value Date   LDLCALC 107 (H) 11/29/2014   Lab Results  Component Value Date   TRIG 60 11/29/2014   Lab Results  Component Value Date   CHOLHDL 2.5 11/29/2014   No results found for: LDLDIRECT Glucose:  Glucose  Date Value Ref Range Status  11/29/2014 88 65 - 99 mg/dL Final   Glucose, Bld  Date Value Ref Range Status  03/28/2016 91 65 - 99 mg/dL Final   AAA: no family history  Review of Systems  Constitutional: Negative for unexpected weight change.  HENT: Negative for hearing loss.   Eyes: Negative for visual disturbance.  Gastrointestinal: Negative for blood in stool.  Genitourinary: Negative for hematuria.   Past Medical History:  Diagnosis Date  . Obesity 03/28/2016    Past Surgical History:  Procedure Laterality Date  . ABDOMINAL HYSTERECTOMY    . BUNIONECTOMY      Family History  Problem Relation Age of Onset  . Cancer Mother        breast (75), colon (22)  . Breast cancer Mother 69  . Cancer Father 37       colon  . Cancer Maternal Grandmother        breast  . Breast cancer Maternal Grandmother 23  . Heart disease Maternal Grandfather        heart failure  . Cancer Maternal Grandfather  lung  . Stroke Paternal Grandmother    Social History   Socioeconomic History  . Marital status: Married    Spouse name: Not on file  . Number of children: Not on file  . Years of education: Not on file  . Highest education level: Not on file  Social Needs  . Financial resource strain: Not on file  . Food insecurity - worry: Not on file  . Food insecurity - inability: Not on file  . Transportation needs - medical: Not on file  . Transportation needs - non-medical: Not on file  Occupational History  . Not on file  Tobacco Use  . Smoking status: Never Smoker  . Smokeless tobacco: Never Used  Substance and Sexual Activity  . Alcohol use: No     Alcohol/week: 0.0 oz  . Drug use: No  . Sexual activity: Not Currently  Other Topics Concern  . Not on file  Social History Narrative  . Not on file   Outpatient Encounter Medications as of 11/29/2016  Medication Sig  . cholecalciferol (VITAMIN D) 1000 units tablet Take 1,000 Units by mouth once a week.  . meloxicam (MOBIC) 15 MG tablet Take 1 tablet (15 mg total) daily as needed by mouth. (take at least one hour AFTER aspirin)  . Multiple Vitamin (MULTIVITAMIN) tablet Take 1 tablet by mouth daily.  . [DISCONTINUED] ibuprofen (ADVIL,MOTRIN) 800 MG tablet TAKE 1 TABLET BY MOUTH 3 TIMES A DAY AFTER FINISHING PREDNISONE  . [DISCONTINUED] meloxicam (MOBIC) 15 MG tablet Take 15 mg as needed by mouth.  Marland Kitchen aspirin EC 81 MG tablet Take 1 tablet (81 mg total) daily by mouth.   No facility-administered encounter medications on file as of 11/29/2016.     No Known Allergies  Care Team Updated in EHR: Yes  Last Vision Exam: a year maybe Wears corrective lenses: Yes Last Dental Exam: 6 months ago Last Hearing Exam: n/a Wears Hearing Aids: No  Functional Ability / Safety Screening 1.  Was the timed Get Up and Go test shorter than 30 seconds?  yes 2.  Does the patient need help with the phone, transportation, shopping,      preparing meals, housework, laundry, medications, or managing money?  no 3.  Is the patient's home free of loose throw rugs in walkways, pet beds, electrical cords, etc?   yes      Grab bars in the bathroom? yes      Handrails on the stairs?   yes outside, single level home      Adequate lighting?   yes 4.  Has the patient noticed any hearing difficulties?   no  Advanced Care Planning: A voluntary discussion about advance care planning including the explanation and discussion of advance directives.  Discussed health care proxy and Living will, and the patient was able to identify a health care proxy as Desiree Carpenter, [phone].  Patient does not have a living will at  present time. If patient does have living will, I have requested they bring this to the clinic to be scanned in to their chart. Does patient have a HCPOA?    no Does patient have a living will or MOST form?  no   Objective:   Vitals: BP 122/62   Pulse 73   Temp 98.1 F (36.7 C) (Oral)   Resp 14   Ht _0  (1.6 m)   Wt 180 lb 6.4 oz (81.8 kg)   SpO2 93%   BMI 31.96 kg/m  Body mass  index is 31.96 kg/m.  No exam data present  Physical Exam  Constitutional: She appears well-developed and well-nourished.  HENT:  Head: Normocephalic and atraumatic.  Right Ear: Hearing, tympanic membrane, external ear and ear canal normal.  Left Ear: Hearing, tympanic membrane, external ear and ear canal normal.  Eyes: Conjunctivae and EOM are normal. Right eye exhibits no hordeolum. Left eye exhibits no hordeolum. No scleral icterus.  Neck: Carotid bruit is not present. No thyromegaly present.  Cardiovascular: Normal rate, regular rhythm, S1 normal, S2 normal and normal heart sounds.  No extrasystoles are present.  Pulmonary/Chest: Effort normal and breath sounds normal. No respiratory distress.  Abdominal: Soft. Normal appearance and bowel sounds are normal. She exhibits no distension, no abdominal bruit, no pulsatile midline mass and no mass. There is no hepatosplenomegaly. There is no tenderness. No hernia.  Musculoskeletal: Normal range of motion. She exhibits no edema.  Lymphadenopathy:       Head (right side): No submandibular adenopathy present.       Head (left side): No submandibular adenopathy present.    She has no cervical adenopathy.    She has no axillary adenopathy.  Neurological: She is alert. She displays no tremor. No cranial nerve deficit. She exhibits normal muscle tone. Gait normal.  Reflex Scores:      Patellar reflexes are 2+ on the right side and 2+ on the left side. Skin: Skin is warm and dry. No bruising and no ecchymosis noted. No cyanosis. No pallor.  Psychiatric: Her  speech is normal and behavior is normal. Thought content normal. Her mood appears not anxious. She does not exhibit a depressed mood.   Cognitive Testing - 6-CIT  Correct? Score   What year is it? yes 0 Yes = 0    No = 4  What month is it? yes 0 Yes = 0    No = 3  Remember:     Phillis Knack, [place]     What time is it? yes 0 Yes = 0    No = 3  Count backwards from 20 to 1 yes 0 Correct = 0    1 error = 2   More than 1 error = 4  Say the months of the year in reverse. yes 0 Correct = 0    1 error = 2   More than 1 error = 4  What address did I ask you to remember? noe 0 -->2 Correct = 0  1 error = 2    2 error = 4    3 error = 6    4 error = 8    All wrong = 10       TOTAL SCORE  2/28   Interpretation:  Normal  Normal (0-7) Abnormal (8-28)   Fall Risk: Fall Risk  11/29/2016 08/24/2016 03/28/2016 10/19/2014  Falls in the past year? No No No No  Risk for fall due to : - - - History of fall(s)    Depression Screen Depression screen [facility] 2/9 11/29/2016 08/24/2016 03/28/2016 10/19/2014  Decreased Interest 0 0 0 0  Down, Depressed, Hopeless 0 0 0 0  PHQ - 2 Score 0 0 0 0    No results found for this or any previous visit (from the past 2160 hour(s)).  Assessment & Plan:    There are no diagnoses linked to this encounter.  Exercise Activities and Dietary recommendations Goals    . Weight (lb) < 168 lb (76.2 kg)      -  Discussed health benefits of physical activity, and encouraged her to engage in regular exercise appropriate for her age and condition.   Immunization History  Administered Date(s) Administered  . Influenza,inj,Quad PF,6+ Mos 10/19/2014    Health Maintenance  Topic Date Due  . PNA vac Low Risk Adult (1 of 2 - PCV13) 11/04/2016  . COLONOSCOPY  01/21/2017  . MAMMOGRAM  03/02/2017  . PAP SMEAR  01/23/2019  . TETANUS/TDAP  10/04/2020  . INFLUENZA VACCINE  Completed  . DEXA SCAN  Completed  . Hepatitis C Screening  Completed  . HIV Screening  Completed     Meds ordered this encounter  Medications  . DISCONTD: meloxicam (MOBIC) 15 MG tablet    Sig: Take 15 mg as needed by mouth.  . meloxicam (MOBIC) 15 MG tablet    Sig: Take 1 tablet (15 mg total) daily as needed by mouth. (take at least one hour AFTER aspirin)    Dispense:  30 tablet    Refill:  2  . aspirin EC 81 MG tablet    Sig: Take 1 tablet (81 mg total) daily by mouth.    Current Outpatient Medications:  .  cholecalciferol (VITAMIN D) 1000 units tablet, Take 1,000 Units by mouth once a week., Disp: , Rfl:  .  meloxicam (MOBIC) 15 MG tablet, Take 1 tablet (15 mg total) daily as needed by mouth. (take at least one hour AFTER aspirin), Disp: 30 tablet, Rfl: 2 .  Multiple Vitamin (MULTIVITAMIN) tablet, Take 1 tablet by mouth daily., Disp: , Rfl:  .  aspirin EC 81 MG tablet, Take 1 tablet (81 mg total) daily by mouth., Disp: , Rfl:  Medications Discontinued During This Encounter  Medication Reason  . ibuprofen (ADVIL,MOTRIN) 800 MG tablet Discontinued by provider  . meloxicam (MOBIC) 15 MG tablet Reorder    I have personally reviewed and addressed the Medicare Annual Wellness health risk assessment questionnaire and have noted the following in the patient's chart:  A.         Medical and social history & family history B.         Use of alcohol, tobacco, and illicit drugs  C.         Current medications and supplements D.         Functional and Cognitive ability and status E.         Nutritional status F.         Physical activity G.        Advance directives H.         List of other physicians I.          Hospitalizations, surgeries, and ER visits in previous 12 months J.         [place] such as hearing, vision, cognitive function, and depression  In addition, I have reviewed and discussed with patient certain preventive protocols, quality metrics, and best practice recommendations. A written personalized care plan for preventive services as well as  general preventive health recommendations were provided to patient.  See attached scanned questionnaire for additional information.   Return in about 1 year (around 11/29/2017) for Medicare Wellness check.   Problem List Items Addressed This Visit      Other   Welcome to Medicare preventive visit - Primary    USPSTF grade A and B recommendations reviewed with patient; age-appropriate recommendations, preventive care, screening tests, etc discussed and encouraged; healthy living encouraged;  see AVS for patient education given to patient      Relevant Orders   EKG 12-Lead   Postmenopausal status   Relevant Orders   DG Bone Density   Obesity    Encouraged weight loss      Medication monitoring encounter    Check kidney function      Full code status    Discussed with patient; at this point in her life, she wishes to be a full code; paperwork given to her and she'll complete and may return when finished; her husband will serve as her HCPOA      Relevant Orders   Full code    Other Visit Diagnoses    Screen for colon cancer       Relevant Orders   Ambulatory referral to Gastroenterology

## 2016-11-29 NOTE — Assessment & Plan Note (Signed)
Order DEXA scan 

## 2016-11-29 NOTE — Assessment & Plan Note (Signed)
USPSTF grade A and B recommendations reviewed with patient; age-appropriate recommendations, preventive care, screening tests, etc discussed and encouraged; healthy living encouraged; see AVS for patient education given to patient  

## 2016-11-29 NOTE — Assessment & Plan Note (Signed)
Encouraged weight loss 

## 2016-11-29 NOTE — Assessment & Plan Note (Signed)
Check kidney function 

## 2016-11-29 NOTE — Assessment & Plan Note (Signed)
Discussed with patient; at this point in her life, she wishes to be a full code; paperwork given to her and she'll complete and may return when finished; her husband will serve as her HCPOA

## 2016-11-29 NOTE — Patient Instructions (Addendum)
Our next weight goal for you will be 168 pounds, and then keep going if desired Check out the information at familydoctor.org entitled "Nutrition for Weight Loss: What You Need to Know about Fad Diets" Try to lose between 1-2 pounds per week by taking in fewer calories and burning off more calories You can succeed by limiting portions, limiting foods dense in calories and fat, becoming more active, and drinking 8 glasses of water a day (64 ounces) Don't skip meals, especially breakfast, as skipping meals may alter your metabolism Do not use over-the-counter weight loss pills or gimmicks that claim rapid weight loss A healthy BMI (or body mass index) is between 18.5 and 24.9 You can calculate your ideal BMI at the [employer] website [employer]fr  Health Maintenance  Topic Date Due  . PNA vac Low Risk Adult (1 of 2 - PCV13) 11/04/2016  . COLONOSCOPY  01/21/2017  . MAMMOGRAM  08/30/2017  . PAP SMEAR  01/23/2019  . TETANUS/TDAP  10/04/2020  . INFLUENZA VACCINE  Completed  . DEXA SCAN  Completed  . Hepatitis C Screening  Completed  . HIV Screening  Completed   DEXA is due now Please do call to schedule your bone density study; the number to schedule one at either [facility] or [facility] is [phone] or [phone] Your next mammogram is actually due on or after April 03, 2017 Please have a pneumonia vaccine called a PCV-13 (Prevnar) done on or after December 16, 2016 Then you will need to have another type of pneumonia vaccine at least one year after that, that's called a PPSV-23 (Pneumovax) You can have the new shingles vaccine done at your local pharmacy when they are available; after your first shot, you will need a second shot (booster) between 2 to 6 months after the first shot We'll have you see the specialist for your upcoming colonoscopy  Health Maintenance, Female Adopting a healthy lifestyle and  getting preventive care can go a long way to promote health and wellness. Talk with your health care provider about what schedule of regular examinations is right for you. This is a good chance for you to check in with your provider about disease prevention and staying healthy. In between checkups, there are plenty of things you can do on your own. Experts have done a lot of research about which lifestyle changes and preventive measures are most likely to keep you healthy. Ask your health care provider for more information. Weight and diet Eat a healthy diet  Be sure to include plenty of vegetables, fruits, low-fat dairy products, and lean protein.  Do not eat a lot of foods high in solid fats, added sugars, or salt.  Get regular exercise. This is one of the most important things you can do for your health. ? Most adults should exercise for at least 150 minutes each week. The exercise should increase your heart rate and make you sweat (moderate-intensity exercise). ? Most adults should also do strengthening exercises at least twice a week. This is in addition to the moderate-intensity exercise.  Maintain a healthy weight  Body mass index (BMI) is a measurement that can be used to identify possible weight problems. It estimates body fat based on height and weight. Your health care provider can help determine your BMI and help you achieve or maintain a healthy weight.  For females 90 years of age and older: ? A BMI below 18.5 is considered underweight. ? A BMI of 18.5 to  24.9 is normal. ? A BMI of 25 to 29.9 is considered overweight. ? A BMI of 30 and above is considered obese.  Watch levels of cholesterol and blood lipids  You should start having your blood tested for lipids and cholesterol at 65 years of age, then have this test every 5 years.  You may need to have your cholesterol levels checked more often if: ? Your lipid or cholesterol levels are high. ? You are older than 65 years of  age. ? You are at high risk for heart disease.  Cancer screening Lung Cancer  Lung cancer screening is recommended for adults 76-30 years old who are at high risk for lung cancer because of a history of smoking.  A yearly low-dose CT scan of the lungs is recommended for people who: ? Currently smoke. ? Have quit within the past 15 years. ? Have at least a 30-pack-year history of smoking. A pack year is smoking an average of one pack of cigarettes a day for 1 year.  Yearly screening should continue until it has been 15 years since you quit.  Yearly screening should stop if you develop a health problem that would prevent you from having lung cancer treatment.  Breast Cancer  Practice breast self-awareness. This means understanding how your breasts normally appear and feel.  It also means doing regular breast self-exams. Let your health care provider know about any changes, no matter how small.  If you are in your 20s or 30s, you should have a clinical breast exam (CBE) by a health care provider every 1-3 years as part of a regular health exam.  If you are 15 or older, have a CBE every year. Also consider having a breast X-ray (mammogram) every year.  If you have a family history of breast cancer, talk to your health care provider about genetic screening.  If you are at high risk for breast cancer, talk to your health care provider about having an MRI and a mammogram every year.  Breast cancer gene (BRCA) assessment is recommended for women who have family members with BRCA-related cancers. BRCA-related cancers include: ? Breast. ? Ovarian. ? Tubal. ? Peritoneal cancers.  Results of the assessment will determine the need for genetic counseling and BRCA1 and BRCA2 testing.  Cervical Cancer Your health care provider may recommend that you be screened regularly for cancer of the pelvic organs (ovaries, uterus, and vagina). This screening involves a pelvic examination, including  checking for microscopic changes to the surface of your cervix (Pap test). You may be encouraged to have this screening done every 3 years, beginning at age 44.  For women ages 69-65, health care providers may recommend pelvic exams and Pap testing every 3 years, or they may recommend the Pap and pelvic exam, combined with testing for human papilloma virus (HPV), every 5 years. Some types of HPV increase your risk of cervical cancer. Testing for HPV may also be done on women of any age with unclear Pap test results.  Other health care providers may not recommend any screening for nonpregnant women who are considered low risk for pelvic cancer and who do not have symptoms. Ask your health care provider if a screening pelvic exam is right for you.  If you have had past treatment for cervical cancer or a condition that could lead to cancer, you need Pap tests and screening for cancer for at least 20 years after your treatment. If Pap tests have been discontinued, your risk factors (  such as having a new sexual partner) need to be reassessed to determine if screening should resume. Some women have medical problems that increase the chance of getting cervical cancer. In these cases, your health care provider may recommend more frequent screening and Pap tests.  Colorectal Cancer  This type of cancer can be detected and often prevented.  Routine colorectal cancer screening usually begins at 65 years of age and continues through 65 years of age.  Your health care provider may recommend screening at an earlier age if you have risk factors for colon cancer.  Your health care provider may also recommend using home test kits to check for hidden blood in the stool.  A small camera at the end of a tube can be used to examine your colon directly (sigmoidoscopy or colonoscopy). This is done to check for the earliest forms of colorectal cancer.  Routine screening usually begins at age 2.  Direct examination of  the colon should be repeated every 5-10 years through 65 years of age. However, you may need to be screened more often if early forms of precancerous polyps or small growths are found.  Skin Cancer  Check your skin from head to toe regularly.  Tell your health care provider about any new moles or changes in moles, especially if there is a change in a mole's shape or color.  Also tell your health care provider if you have a mole that is larger than the size of a pencil eraser.  Always use sunscreen. Apply sunscreen liberally and repeatedly throughout the day.  Protect yourself by wearing long sleeves, pants, a wide-brimmed hat, and sunglasses whenever you are outside.  Heart disease, diabetes, and high blood pressure  High blood pressure causes heart disease and increases the risk of stroke. High blood pressure is more likely to develop in: ? People who have blood pressure in the high end of the normal range (130-139/85-89 mm Hg). ? People who are overweight or obese. ? People who are African American.  If you are 65-33 years of age, have your blood pressure checked every 3-5 years. If you are 48 years of age or older, have your blood pressure checked every year. You should have your blood pressure measured twice-once when you are at a hospital or clinic, and once when you are not at a hospital or clinic. Record the average of the two measurements. To check your blood pressure when you are not at a hospital or clinic, you can use: ? An automated blood pressure machine at a pharmacy. ? A home blood pressure monitor.  If you are between 13 years and 6 years old, ask your health care provider if you should take aspirin to prevent strokes.  Have regular diabetes screenings. This involves taking a blood sample to check your fasting blood sugar level. ? If you are at a normal weight and have a low risk for diabetes, have this test once every three years after 65 years of age. ? If you are  overweight and have a high risk for diabetes, consider being tested at a younger age or more often. Preventing infection Hepatitis B  If you have a higher risk for hepatitis B, you should be screened for this virus. You are considered at high risk for hepatitis B if: ? You were born in a country where hepatitis B is common. Ask your health care provider which countries are considered high risk. ? Your parents were born in a high-risk country, and  you have not been immunized against hepatitis B (hepatitis B vaccine). ? You have HIV or AIDS. ? You use needles to inject street drugs. ? You live with someone who has hepatitis B. ? You have had sex with someone who has hepatitis B. ? You get hemodialysis treatment. ? You take certain medicines for conditions, including cancer, organ transplantation, and autoimmune conditions.  Hepatitis C  Blood testing is recommended for: ? Everyone born from [birth year] through 1964. ? Anyone with known risk factors for hepatitis C.  Sexually transmitted infections (STIs)  You should be screened for sexually transmitted infections (STIs) including gonorrhea and chlamydia if: ? You are sexually active and are younger than 65 years of age. ? You are older than 65 years of age and your health care provider tells you that you are at risk for this type of infection. ? Your sexual activity has changed since you were last screened and you are at an increased risk for chlamydia or gonorrhea. Ask your health care provider if you are at risk.  If you do not have HIV, but are at risk, it may be recommended that you take a prescription medicine daily to prevent HIV infection. This is called pre-exposure prophylaxis (PrEP). You are considered at risk if: ? You are sexually active and do not regularly use condoms or know the HIV status of your partner(s). ? You take drugs by injection. ? You are sexually active with a partner who has HIV.  Talk with your health care provider  about whether you are at high risk of being infected with HIV. If you choose to begin PrEP, you should first be tested for HIV. You should then be tested every 3 months for as long as you are taking PrEP. Pregnancy  If you are premenopausal and you may become pregnant, ask your health care provider about preconception counseling.  If you may become pregnant, take 400 to 800 micrograms (mcg) of folic acid every day.  If you want to prevent pregnancy, talk to your health care provider about birth control (contraception). Osteoporosis and menopause  Osteoporosis is a disease in which the bones lose minerals and strength with aging. This can result in serious bone fractures. Your risk for osteoporosis can be identified using a bone density scan.  If you are 32 years of age or older, or if you are at risk for osteoporosis and fractures, ask your health care provider if you should be screened.  Ask your health care provider whether you should take a calcium or vitamin D supplement to lower your risk for osteoporosis.  Menopause may have certain physical symptoms and risks.  Hormone replacement therapy may reduce some of these symptoms and risks. Talk to your health care provider about whether hormone replacement therapy is right for you. Follow these instructions at home:  Schedule regular health, dental, and eye exams.  Stay current with your immunizations.  Do not use any tobacco products including cigarettes, chewing tobacco, or electronic cigarettes.  If you are pregnant, do not drink alcohol.  If you are breastfeeding, limit how much and how often you drink alcohol.  Limit alcohol intake to no more than 1 drink per day for nonpregnant women. One drink equals 12 ounces of beer, 5 ounces of wine, or 1 ounces of hard liquor.  Do not use street drugs.  Do not share needles.  Ask your health care provider for help if you need support or information about quitting drugs.  Tell your  Tell your health care provider if you often feel depressed.  Tell your health care provider if you have ever been abused or do not feel safe at home. This information is not intended to replace advice given to you by your health care provider. Make sure you discuss any questions you have with your health care provider. Document Released: 07/24/2010 Document Revised: 06/16/2015 Document Reviewed: 10/12/2014 Elsevier Interactive Patient Education  2018 Elsevier Inc.  

## 2017-01-11 ENCOUNTER — Other Ambulatory Visit: Payer: Self-pay

## 2017-01-11 MED ORDER — MELOXICAM 15 MG PO TABS: 15.0000 mg | ORAL_TABLET | Freq: Every day | ORAL | 0 refills | Status: AC | PRN

## 2017-01-11 NOTE — Telephone Encounter (Signed)
Incoming request from pharmacy and insurance to change rx for meloxicam to a 90 day supply. Please advise.

## 2017-02-06 DIAGNOSIS — R69 Illness, unspecified: Secondary | ICD-10-CM | POA: Diagnosis not present

## 2017-02-13 ENCOUNTER — Other Ambulatory Visit: Payer: Self-pay

## 2017-02-13 ENCOUNTER — Telehealth: Payer: Self-pay

## 2017-02-13 DIAGNOSIS — Z1211 Encounter for screening for malignant neoplasm of colon: Secondary | ICD-10-CM

## 2017-02-13 NOTE — Telephone Encounter (Signed)
Gastroenterology Pre-Procedure Review  Request Date: 02/28/17 Requesting Physician: Dr. Vicente Males  PATIENT REVIEW QUESTIONS: The patient responded to the following health history questions as indicated:    1. Are you having any GI issues? no 2. Do you have a personal history of Polyps? no 3. Do you have a family history of Colon Cancer or Polyps? yes (mom and dad colon cancer) 4. Diabetes Mellitus? no 5. Joint replacements in the past 12 months?no 6. Major health problems in the past 3 months?no 7. Any artificial heart valves, MVP, or defibrillator?no    MEDICATIONS & ALLERGIES:    Patient reports the following regarding taking any anticoagulation/antiplatelet therapy:   Plavix, Coumadin, Eliquis, Xarelto, Lovenox, Pradaxa, Brilinta, or Effient? no Aspirin? no  Patient confirms/reports the following medications:  Current Outpatient Medications  Medication Sig Dispense Refill  . aspirin EC 81 MG tablet Take 1 tablet (81 mg total) daily by mouth.    . cholecalciferol (VITAMIN D) 1000 units tablet Take 1,000 Units by mouth once a week.    . meloxicam (MOBIC) 15 MG tablet Take 1 tablet (15 mg total) by mouth daily as needed. (take at least one hour AFTER aspirin) 90 tablet 0  . Multiple Vitamin (MULTIVITAMIN) tablet Take 1 tablet by mouth daily.     No current facility-administered medications for this visit.     Patient confirms/reports the following allergies:  No Known Allergies  No orders of the defined types were placed in this encounter.   AUTHORIZATION INFORMATION Primary Insurance: 1D#: Group #:  Secondary Insurance: 1D#: Group #:  SCHEDULE INFORMATION: Date: 02/28/17 Time: Location:[facility]

## 2017-02-25 ENCOUNTER — Other Ambulatory Visit: Payer: Self-pay

## 2017-02-25 ENCOUNTER — Telehealth: Payer: Self-pay | Admitting: Gastroenterology

## 2017-02-25 MED ORDER — PEG 3350-KCL-NABCB-NACL-NASULF 236 G PO SOLR: 4000.0000 mL | Freq: Once | ORAL | 0 refills | Status: AC

## 2017-02-25 NOTE — Telephone Encounter (Signed)
Patient called & l/m on machine stating the bowel prep was to expensive. Is there any other alternatives? Her colonoscopy is scheduled for 02-28-17 .

## 2017-02-25 NOTE — Telephone Encounter (Signed)
PATIENT CALLED BACK & MICHELL STATED SHE WOULD CALL IN GO-[facility] FOR PATIENT TO TAKE 8OZ EVERY 20-30 MINS UNTIL STOOL WAS CLEAR. CALL INTO THE [facility] IN [place].

## 2017-02-25 NOTE — Telephone Encounter (Signed)
Prescription for Golytely has been faxed to [place].

## 2017-02-28 ENCOUNTER — Ambulatory Visit: Payer: Medicare HMO | Admitting: Anesthesiology

## 2017-02-28 ENCOUNTER — Encounter
Admission: RE | Disposition: A | Discharge status: Home / Self Care | Payer: Self-pay | Source: Ambulatory Visit | Attending: Gastroenterology

## 2017-02-28 ENCOUNTER — Encounter: Payer: Self-pay | Admitting: *Deleted

## 2017-02-28 ENCOUNTER — Ambulatory Visit
Admission: RE | Admit: 2017-02-28 | Discharge: 2017-02-28 | Disposition: A | Discharge status: Home / Self Care | Payer: Medicare HMO | Source: Ambulatory Visit | Attending: Gastroenterology | Admitting: Gastroenterology

## 2017-02-28 DIAGNOSIS — Z538 Procedure and treatment not carried out for other reasons: Secondary | ICD-10-CM | POA: Insufficient documentation

## 2017-02-28 DIAGNOSIS — Z1211 Encounter for screening for malignant neoplasm of colon: Secondary | ICD-10-CM | POA: Insufficient documentation

## 2017-02-28 DIAGNOSIS — Z79899 Other long term (current) drug therapy: Secondary | ICD-10-CM | POA: Diagnosis not present

## 2017-02-28 DIAGNOSIS — Z7982 Long term (current) use of aspirin: Secondary | ICD-10-CM | POA: Insufficient documentation

## 2017-02-28 HISTORY — PX: COLONOSCOPY WITH PROPOFOL: SHX5780

## 2017-02-28 SURGERY — COLONOSCOPY WITH PROPOFOL
Anesthesia: General

## 2017-02-28 MED ORDER — PROPOFOL 500 MG/50ML IV EMUL
INTRAVENOUS | Status: AC
Start: 2017-02-28 — End: ?
  Filled 2017-02-28: qty 50

## 2017-02-28 MED ORDER — LACTATED RINGERS IV SOLN
INTRAVENOUS | Status: DC | PRN
  Administered 2017-02-28: 10:00:00 via INTRAVENOUS

## 2017-02-28 MED ORDER — PROPOFOL 10 MG/ML IV BOLUS
INTRAVENOUS | Status: DC | PRN
  Administered 2017-02-28: 100 mg via INTRAVENOUS

## 2017-02-28 MED ORDER — SODIUM CHLORIDE 0.9 % IV SOLN
INTRAVENOUS | Status: DC
  Administered 2017-02-28: 08:00:00 via INTRAVENOUS

## 2017-02-28 MED ORDER — PROPOFOL 500 MG/50ML IV EMUL
INTRAVENOUS | Status: DC | PRN
  Administered 2017-02-28: 140 ug/kg/min via INTRAVENOUS

## 2017-02-28 NOTE — Transfer of Care (Signed)
Immediate Anesthesia Transfer of Care Note  Patient: Desiree Carpenter  Procedure(s) Performed: COLONOSCOPY WITH PROPOFOL (N/A )  Patient Location: Endoscopy Unit  Anesthesia Type:General  Level of Consciousness: awake  Airway & Oxygen Therapy: Patient Spontanous Breathing and Patient connected to nasal cannula oxygen  Post-op Assessment: Report given to RN and Post -op Vital signs reviewed and stable  Post vital signs: Reviewed and stable  Last Vitals:  Vitals:   02/28/17 0810  BP: 128/88  Pulse: (!) 54  Resp: (!) 22  Temp: (!) 36.3 C  SpO2: 100%    Last Pain:  Vitals:   02/28/17 0810  TempSrc: Tympanic         Complications: No apparent anesthesia complications

## 2017-02-28 NOTE — H&P (Signed)
Jonathon Bellows, MD [address] [place], [place], [place], [address] [address], [place], [place], [place], [address] Phone: [phone]  Fax: [phone]  Primary Care Physician:  Arnetha Courser, MD   Pre-Procedure History & Physical: HPI:  Desiree Carpenter is a 67 y.o. female is here for an colonoscopy.   Past Medical History:  Diagnosis Date  . Obesity 03/28/2016    Past Surgical History:  Procedure Laterality Date  . ABDOMINAL HYSTERECTOMY    . BUNIONECTOMY      Prior to Admission medications   Medication Sig Start Date End Date Taking? Authorizing Provider  Multiple Vitamin (MULTIVITAMIN) tablet Take 1 tablet by mouth daily.   Yes [provider]  aspirin EC 81 MG tablet Take 1 tablet (81 mg total) daily by mouth. 11/29/16   Arnetha Courser, MD  cholecalciferol (VITAMIN D) 1000 units tablet Take 1,000 Units by mouth once a week.    [provider]  meloxicam (MOBIC) 15 MG tablet Take 1 tablet (15 mg total) by mouth daily as needed. (take at least one hour AFTER aspirin) 01/11/17 01/11/18  Arnetha Courser, MD    Allergies as of 02/13/2017  . (No Known Allergies)    Family History  Problem Relation Age of Onset  . Cancer Mother        breast (61), colon (28)  . Breast cancer Mother 4  . Cancer Father 30       colon  . Cancer Maternal Grandmother        breast  . Breast cancer Maternal Grandmother 3  . Heart disease Maternal Grandfather        heart failure  . Cancer Maternal Grandfather        lung  . Stroke Paternal Grandmother     Social History   Socioeconomic History  . Marital status: Married    Spouse name: Not on file  . Number of children: Not on file  . Years of education: Not on file  . Highest education level: Not on file  Social Needs  . Financial resource strain: Not on file  . Food insecurity - worry: Not on file  . Food insecurity - inability: Not on file  . Transportation needs - medical: Not on file    . Transportation needs - non-medical: Not on file  Occupational History  . Not on file  Tobacco Use  . Smoking status: Never Smoker  . Smokeless tobacco: Never Used  Substance and Sexual Activity  . Alcohol use: No    Alcohol/week: 0.0 oz  . Drug use: No  . Sexual activity: Not Currently  Other Topics Concern  . Not on file  Social History Narrative  . Not on file    Review of Systems: See HPI, otherwise negative ROS  Physical Exam: BP 128/88   Pulse (!) 54   Temp (!) 97.4 F (36.3 C) (Tympanic)   Resp (!) 22   Ht 5\' 3"  (1.6 m)   Wt 180 lb (81.6 kg)   SpO2 100%   BMI 31.89 kg/m  General:   Alert,  pleasant and cooperative in NAD Head:  Normocephalic and atraumatic. Neck:  Supple; no masses or thyromegaly. Lungs:  Clear throughout to auscultation, normal respiratory effort.    Heart:  +S1, +S2, Regular rate and rhythm, No edema. Abdomen:  Soft, nontender and nondistended. Normal bowel sounds, without guarding, and without rebound.   Neurologic:  Alert and  oriented x4;  grossly normal neurologically.  Impression/Plan: Desiree Carpenter is here for an colonoscopy to be performed for Screening colonoscopy with family history of colon cancer. Risks, benefits, limitations, and alternatives regarding  colonoscopy have been reviewed with the patient.  Questions have been answered.  All parties agreeable.   Jonathon Bellows, MD  02/28/2017, 10:19 AM

## 2017-02-28 NOTE — Anesthesia Preprocedure Evaluation (Signed)
Anesthesia Evaluation  Patient identified by MRN, date of birth, ID band Patient awake    Reviewed: Allergy & Precautions, H&P , NPO status , Patient's Chart, lab work & pertinent test results  History of Anesthesia Complications Negative for: history of anesthetic complications  Airway Mallampati: III  TM Distance: >3 FB Neck ROM: limited    Dental  (+) Chipped   Pulmonary neg pulmonary ROS, neg shortness of breath,           Cardiovascular Exercise Tolerance: Good (-) angina(-) Past MI and (-) DOE negative cardio ROS       Neuro/Psych negative neurological ROS  negative psych ROS   GI/Hepatic negative GI ROS, Neg liver ROS, neg GERD  ,  Endo/Other  negative endocrine ROS  Renal/GU negative Renal ROS  negative genitourinary   Musculoskeletal   Abdominal   Peds  Hematology negative hematology ROS (+)   Anesthesia Other Findings Past Medical History: 03/28/2016: Obesity  Past Surgical History: No date: ABDOMINAL HYSTERECTOMY No date: BUNIONECTOMY  BMI    Body Mass Index:  31.89 kg/m      Reproductive/Obstetrics negative OB ROS                             Anesthesia Physical Anesthesia Plan  ASA: II  Anesthesia Plan: General   Post-op Pain Management:    Induction: Intravenous  PONV Risk Score and Plan: Propofol infusion and TIVA  Airway Management Planned: Natural Airway and Nasal Cannula  Additional Equipment:   Intra-op Plan:   Post-operative Plan:   Informed Consent: I have reviewed the patients History and Physical, chart, labs and discussed the procedure including the risks, benefits and alternatives for the proposed anesthesia with the patient or authorized representative who has indicated his/her understanding and acceptance.   Dental Advisory Given  Plan Discussed with: Anesthesiologist, CRNA and Surgeon  Anesthesia Plan Comments: (Patient consented for  risks of anesthesia including but not limited to:  - adverse reactions to medications - risk of intubation if required - damage to teeth, lips or other oral mucosa - sore throat or hoarseness - Damage to heart, brain, lungs or loss of life  Patient voiced understanding.)        Anesthesia Quick Evaluation

## 2017-02-28 NOTE — Op Note (Signed)
[facility] Patient Name: Desiree Carpenter Procedure Date: 02/28/2017 10:16 AM MRN: [id number] Account #: [id number] Date of Birth: January 07, 1951 Admit Type: Outpatient Age: 67 Room: [facility] ENDO ROOM 4 Gender: Female Note Status: Finalized Procedure:            Colonoscopy Providers:            Jonathon Bellows MD, MD Referring MD:         Arnetha Courser (Referring MD) Medicines:            Monitored Anesthesia Care Complications:        No immediate complications. Procedure:            Pre-Anesthesia Assessment:                       - Prior to the procedure, a History and Physical was                        performed, and patient medications, allergies and                        sensitivities were reviewed. The patient's tolerance of                        previous anesthesia was reviewed.                       - The risks and benefits of the procedure and the                        sedation options and risks were discussed with the                        patient. All questions were answered and informed                        consent was obtained.                       - ASA Grade Assessment: II - A patient with mild                        systemic disease.                       After obtaining informed consent, the colonoscope was                        passed under direct vision. Throughout the procedure,                        the patient's blood pressure, pulse, and oxygen                        saturations were monitored continuously. The                        Colonoscope was introduced through the anus with the                        intention of advancing to the cecum. The scope was  advanced to the sigmoid colon before the procedure was                        aborted. Medications were given. The colonoscopy was                        performed with ease. The patient tolerated the                        procedure well. The  quality of the bowel preparation                        was 100 percent obscured. The colonoscopy was aborted                        due to inadequate bowel prep. The colonoscopy was                        performed with ease. The patient tolerated the                        procedure well. Findings:      Solid stool was found in the sigmoid colon, making visualization       difficult. Impression:           - The procedure was aborted due to inadequate bowel                        prep.                       - Stool in the sigmoid colon.                       - No specimens collected. Recommendation:       - Discharge patient to home (with escort).                       - Resume previous diet.                       - Continue present medications.                       - Repeat colonoscopy in 2 weeks because the bowel                        preparation was suboptimal. Procedure Code(s):    --- Professional ---                       [id number], 53, Colonoscopy, flexible; diagnostic, including                        collection of specimen(s) by brushing or washing, when                        performed (separate procedure) Diagnosis Code(s):    --- Professional ---                       Z53.8, Procedure and treatment not carried out for  other reasons CPT copyright 2016 American Medical Association. All rights reserved. The codes documented in this report are preliminary and upon coder review may  be revised to meet current compliance requirements. Jonathon Bellows, MD Jonathon Bellows MD, MD 02/28/2017 10:36:47 AM This report has been signed electronically. Number of Addenda: 0 Note Initiated On: 02/28/2017 10:16 AM Total Procedure Duration: 0 hours 1 minute 0 seconds       [facility]

## 2017-02-28 NOTE — Progress Notes (Signed)
Colonoscopy aborted due to stool present in colon

## 2017-02-28 NOTE — Anesthesia Post-op Follow-up Note (Signed)
Anesthesia QCDR form completed.        

## 2017-02-28 NOTE — Anesthesia Postprocedure Evaluation (Signed)
Anesthesia Post Note  Patient: Desiree Carpenter  Procedure(s) Performed: COLONOSCOPY WITH PROPOFOL (N/A )  Patient location during evaluation: Endoscopy Anesthesia Type: General Level of consciousness: awake and alert Pain management: pain level controlled Vital Signs Assessment: post-procedure vital signs reviewed and stable Respiratory status: spontaneous breathing, nonlabored ventilation, respiratory function stable and patient connected to nasal cannula oxygen Cardiovascular status: blood pressure returned to baseline and stable Postop Assessment: no apparent nausea or vomiting Anesthetic complications: no     Last Vitals:  Vitals:   02/28/17 1050 02/28/17 1100  BP: 122/71 112/86  Pulse: (!) 49 (!) 52  Resp: 15 10  Temp:    SpO2: 99% 100%    Last Pain:  Vitals:   02/28/17 1038  TempSrc: Tympanic                 Precious Haws Shakir Petrosino

## 2017-03-11 NOTE — Progress Notes (Signed)
Signing off on abstraction from March 2018

## 2017-03-19 ENCOUNTER — Telehealth: Payer: Self-pay | Admitting: Gastroenterology

## 2017-03-19 NOTE — Telephone Encounter (Signed)
Patient  LVM that she needs to r/s her procedure.

## 2017-03-19 NOTE — Telephone Encounter (Signed)
Patient would like to check with her insurance company to see if her repeat colonoscopy due to poor bowel prep will be covered.  I advised her to check with her insurance , however I did not think repeat due to poor bowel prep is covered.  She will check with her insurance company and call back to schedule.

## 2017-03-25 DIAGNOSIS — R69 Illness, unspecified: Secondary | ICD-10-CM | POA: Diagnosis not present

## 2017-03-26 DIAGNOSIS — Z823 Family history of stroke: Secondary | ICD-10-CM | POA: Diagnosis not present

## 2017-03-26 DIAGNOSIS — J309 Allergic rhinitis, unspecified: Secondary | ICD-10-CM | POA: Diagnosis not present

## 2017-03-26 DIAGNOSIS — G8929 Other chronic pain: Secondary | ICD-10-CM | POA: Diagnosis not present

## 2017-03-26 DIAGNOSIS — Z809 Family history of malignant neoplasm, unspecified: Secondary | ICD-10-CM | POA: Diagnosis not present

## 2017-03-26 DIAGNOSIS — Z791 Long term (current) use of non-steroidal anti-inflammatories (NSAID): Secondary | ICD-10-CM | POA: Diagnosis not present

## 2017-03-26 DIAGNOSIS — Z6832 Body mass index (BMI) 32.0-32.9, adult: Secondary | ICD-10-CM | POA: Diagnosis not present

## 2017-03-26 DIAGNOSIS — Z803 Family history of malignant neoplasm of breast: Secondary | ICD-10-CM | POA: Diagnosis not present

## 2017-03-26 DIAGNOSIS — Z833 Family history of diabetes mellitus: Secondary | ICD-10-CM | POA: Diagnosis not present

## 2017-03-26 DIAGNOSIS — E669 Obesity, unspecified: Secondary | ICD-10-CM | POA: Diagnosis not present

## 2017-04-04 DIAGNOSIS — R69 Illness, unspecified: Secondary | ICD-10-CM | POA: Diagnosis not present

## 2017-05-01 ENCOUNTER — Telehealth: Payer: Self-pay | Admitting: Family Medicine

## 2017-05-01 DIAGNOSIS — Z1239 Encounter for other screening for malignant neoplasm of breast: Secondary | ICD-10-CM

## 2017-05-01 NOTE — Telephone Encounter (Signed)
Please recommend that patient get her PCV-13 immunization soon; we discussed this at her Welcome to Medicare visit last fall She's also overdue for her mammogram; please order and have her schedule Thank you

## 2017-05-02 NOTE — Telephone Encounter (Signed)
Left pt a detailed voicemail 

## 2017-05-06 ENCOUNTER — Other Ambulatory Visit: Payer: Self-pay | Admitting: Family Medicine

## 2017-05-06 ENCOUNTER — Ambulatory Visit (INDEPENDENT_AMBULATORY_CARE_PROVIDER_SITE_OTHER): Payer: Medicare HMO

## 2017-05-06 DIAGNOSIS — Z23 Encounter for immunization: Secondary | ICD-10-CM

## 2017-05-06 NOTE — Progress Notes (Signed)
PCV13 ordered

## 2017-05-07 DIAGNOSIS — Z23 Encounter for immunization: Secondary | ICD-10-CM | POA: Insufficient documentation

## 2017-05-08 DIAGNOSIS — Z20828 Contact with and (suspected) exposure to other viral communicable diseases: Secondary | ICD-10-CM | POA: Diagnosis not present

## 2017-05-08 DIAGNOSIS — R52 Pain, unspecified: Secondary | ICD-10-CM | POA: Diagnosis not present

## 2017-05-08 DIAGNOSIS — R05 Cough: Secondary | ICD-10-CM | POA: Diagnosis not present

## 2017-05-08 DIAGNOSIS — J019 Acute sinusitis, unspecified: Secondary | ICD-10-CM | POA: Diagnosis not present

## 2017-08-12 ENCOUNTER — Ambulatory Visit: Payer: Self-pay

## 2017-08-12 NOTE — Telephone Encounter (Signed)
Patient called in with c/o "ankle swelling." She says "my ankles are puffy and it started swelling yesterday. I can still wear shoes. No pain." I asked about other symptoms, she denies. According to protocol, see PCP within 3 days, appointment already scheduled for tomorrow at 1340 with Dr. Sanda Klein, care advice given, patient verbalized understanding.  Reason for Disposition . Swollen ankle joint  (Exception: area of localized swelling which is itchy)  Answer Assessment - Initial Assessment Questions 1. LOCATION: "Which joint is swollen?"     Both ankles and feet 2. ONSET: "When did the swelling start?"     Yesterday 3. SIZE: "How large is the swelling?"     Puffy 4. PAIN: "Is there any pain?" If so, ask: "How bad is it?" (Scale 1-10; or mild, moderate, severe)     No 5. CAUSE: "What do you think caused the swollen joint?"     I don't know 6. OTHER SYMPTOMS: "Do you have any other symptoms?" (e.g., fever, chest pain, difficulty breathing, calf pain)     No 7. PREGNANCY: "Is there any chance you are pregnant?" "When was your last menstrual period?"     No  Protocols used: ANKLE SWELLING-A-AH

## 2017-08-13 ENCOUNTER — Ambulatory Visit (INDEPENDENT_AMBULATORY_CARE_PROVIDER_SITE_OTHER): Payer: Medicare HMO | Admitting: Family Medicine

## 2017-08-13 ENCOUNTER — Encounter (INDEPENDENT_AMBULATORY_CARE_PROVIDER_SITE_OTHER): Payer: Self-pay

## 2017-08-13 ENCOUNTER — Encounter: Payer: Self-pay | Admitting: Family Medicine

## 2017-08-13 VITALS — BP 124/64 | HR 71 | Temp 98.1°F | Resp 12 | Ht 63.0 in | Wt 186.4 lb

## 2017-08-13 DIAGNOSIS — R69 Illness, unspecified: Secondary | ICD-10-CM | POA: Diagnosis not present

## 2017-08-13 DIAGNOSIS — M654 Radial styloid tenosynovitis [de Quervain]: Secondary | ICD-10-CM | POA: Diagnosis not present

## 2017-08-13 DIAGNOSIS — I872 Venous insufficiency (chronic) (peripheral): Secondary | ICD-10-CM

## 2017-08-13 MED ORDER — WRIST BRACE/LEFT MEDIUM MISC: 0 refills | Status: DC

## 2017-08-13 NOTE — Patient Instructions (Addendum)
Use a thumb spica splint for much of the day You can use the meloxicam once a day De Quervain Tenosynovitis Tendons attach muscles to bones. They also help with joint movements. When tendons become irritated or swollen, it is called tendinitis. The extensor pollicis brevis (EPB) tendon connects the EPB muscle to a bone that is near the base of the thumb. The EPB muscle helps to straighten and extend the thumb. De Quervain tenosynovitis is a condition in which the EPB tendon lining (sheath) becomes irritated, thickened, and swollen. This condition is sometimes called stenosing tenosynovitis. This condition causes pain on the thumb side of the back of the wrist. What are the causes? Causes of this condition include:  Activities that repeatedly cause your thumb and wrist to extend.  A sudden increase in activity or change in activity that affects your wrist.  What increases the risk? This condition is more likely to develop in:  Females.  People who have diabetes.  Women who have recently given birth.  People who are over 8 years of age.  People who do activities that involve repeated hand and wrist motions, such as tennis, racquetball, volleyball, gardening, and taking care of children.  People who do heavy labor.  People who have poor wrist strength and flexibility.  People who do not warm up properly before activities.  What are the signs or symptoms? Symptoms of this condition include:  Pain or tenderness over the thumb side of the back of the wrist when your thumb and wrist are not moving.  Pain that gets worse when you straighten your thumb or extend your thumb or wrist.  Pain when the injured area is touched.  Locking or catching of the thumb joint while you bend and straighten your thumb.  Decreased thumb motion due to pain.  Swelling over the affected area.  How is this diagnosed? This condition is diagnosed with a medical history and physical exam. Your health  care provider will ask for details about your injury and ask about your symptoms. How is this treated? Treatment may include the use of icing and medicines to reduce pain and swelling. You may also be advised to wear a splint or brace to limit your thumb and wrist motion. In less severe cases, treatment may also include working with a physical therapist to strengthen your wrist and calm the irritation around your EPB tendon sheath. In severe cases, surgery may be needed. Follow these instructions at home: If you have a splint or brace:  Wear it as told by your health care provider. Remove it only as told by your health care provider.  Loosen the splint or brace if your fingers become numb and tingle, or if they turn cold and blue.  Keep the splint or brace clean and dry. Managing pain, stiffness, and swelling  If directed, apply ice to the injured area. ? Put ice in a plastic bag. ? Place a towel between your skin and the bag. ? Leave the ice on for 20 minutes, 2-3 times per day.  Move your fingers often to avoid stiffness and to lessen swelling.  Raise (elevate) the injured area above the level of your heart while you are sitting or lying down. General instructions  Return to your normal activities as told by your health care provider. Ask your health care provider what activities are safe for you.  Take over-the-counter and prescription medicines only as told by your health care provider.  Keep all follow-up visits as told  by your health care provider. This is important.  Do not drive or operate heavy machinery while taking prescription pain medicine. Contact a health care provider if:  Your pain, tenderness, or swelling gets worse, even if you have had treatment.  You have numbness or tingling in your wrist, hand, or fingers on the injured side. This information is not intended to replace advice given to you by your health care provider. Make sure you discuss any questions you  have with your health care provider. Document Released: 01/08/2005 Document Revised: 06/16/2015 Document Reviewed: 03/16/2014 Elsevier Interactive Patient Education  [employer].

## 2017-08-13 NOTE — Progress Notes (Signed)
BP 124/64   Pulse 71   Temp 98.1 F (36.7 C) (Oral)   Resp 12   Ht 5\' 3"  (1.6 m)   Wt 186 lb 6.4 oz (84.6 kg)   SpO2 97%   BMI 33.02 kg/m    Subjective:    Patient ID: Desiree Carpenter, female    DOB: 1950/09/22, 67 y.o.   MRN: [id number]  HPI: Desiree Carpenter is a 67 y.o. female  Chief Complaint  Patient presents with  . Edema    left wrist and bilateral feet and ankes    HPI  Left wrist pain  States pain in left wrist and swelling for about 3 weeks. Pain is the same- constant and worse with movements. Described as achey pain- mild. Denies injuries or weakness. Pain runs down to thumb. Hasn't done anything for the pain. She is right-handed. Helped mother move recently. Has not tried heat or ice; nothing similar; she has some meloxicam at home, left over; no blood in the stool, no stomach ulcer  Bilateral Ankles Swelling Bilateral ankle swelling started 3 days ago. States has done a lot of traveling in the past few weeks and has been on her feet or sitting a lot. States swelling decreased yesterday after elevation.  Denies shortness of breath, chest pain, redness in legs, leg pain. Tries to avoid salt and doesn't add additional salt. Drinks a good amount of water- at least 10 glasses a day.   Depression screen [facility] 2/9 08/13/2017 11/29/2016 08/24/2016 03/28/2016 10/19/2014  Decreased Interest 0 0 0 0 0  Down, Depressed, Hopeless 0 0 0 0 0  PHQ - 2 Score 0 0 0 0 0    Relevant past medical, surgical, family and social history reviewed Past Medical History:  Diagnosis Date  . Obesity 03/28/2016   Past Surgical History:  Procedure Laterality Date  . ABDOMINAL HYSTERECTOMY    . BUNIONECTOMY    . COLONOSCOPY WITH PROPOFOL N/A 02/28/2017   Procedure: COLONOSCOPY WITH PROPOFOL;  Surgeon: Jonathon Bellows, MD;  Location: [facility] ENDOSCOPY;  Service: Gastroenterology;  Laterality: N/A;   Family History  Problem Relation Age of Onset  . Cancer Mother        breast (74), colon (8)  .  Breast cancer Mother 27  . Cancer Father 6       colon  . Cancer Maternal Grandmother        breast  . Breast cancer Maternal Grandmother 32  . Heart disease Maternal Grandfather        heart failure  . Cancer Maternal Grandfather        lung  . Stroke Paternal Grandmother    Social History   Tobacco Use  . Smoking status: Never Smoker  . Smokeless tobacco: Never Used  Substance Use Topics  . Alcohol use: No    Alcohol/week: 0.0 oz  . Drug use: No    Interim medical history since last visit reviewed. Allergies and medications reviewed  Review of Systems Per HPI unless specifically indicated above     Objective:    BP 124/64   Pulse 71   Temp 98.1 F (36.7 C) (Oral)   Resp 12   Ht 5\' 3"  (1.6 m)   Wt 186 lb 6.4 oz (84.6 kg)   SpO2 97%   BMI 33.02 kg/m   Wt Readings from Last 3 Encounters:  08/13/17 186 lb 6.4 oz (84.6 kg)  02/28/17 180 lb (81.6 kg)  11/29/16 180 lb 6.4 oz (81.8  kg)    Physical Exam  Constitutional: She appears well-developed and well-nourished.  HENT:  Mouth/Throat: Mucous membranes are normal.  Eyes: EOM are normal. No scleral icterus.  Cardiovascular: Normal rate and regular rhythm.  Pulmonary/Chest: Effort normal and breath sounds normal.  Musculoskeletal: She exhibits no edema (no visible edema today).       Left wrist: She exhibits tenderness and swelling. She exhibits no deformity.       Arms: No calf swelling, erythema, increased temp to touch  Psychiatric: She has a normal mood and affect. Her behavior is normal. Her mood appears not anxious.    Results for orders placed or performed in visit on 03/28/16  Hemoglobin A1c  Result Value Ref Range   Hemoglobin A1C 5.5       Assessment & Plan:   Problem List Items Addressed This Visit    None    Visit Diagnoses    De Quervain's tenosynovitis, left    -  Primary   discussed dx, treatment options; brace, avoid repetitive movements; see AVS   Venous insufficiency of both lower  extremities       suspect mild venous insufficiency; nothing to suggest DVT       Follow up plan: No follow-ups on file.  An after-visit summary was printed and given to the patient at [place].  Please see the patient instructions which may contain other information and recommendations beyond what is mentioned above in the assessment and plan.  Meds ordered this encounter  Medications  . Elastic Bandages & Supports (WRIST BRACE/LEFT MEDIUM) MISC    Sig: Thumb spica splint for dequervains; NOT carpal tunnel; left wrist/hand    Dispense:  1 each    Refill:  0    No orders of the defined types were placed in this encounter.

## 2017-09-18 ENCOUNTER — Ambulatory Visit
Admission: RE | Admit: 2017-09-18 | Discharge: 2017-09-18 | Disposition: A | Discharge status: Home / Self Care | Payer: Medicare HMO | Source: Ambulatory Visit | Attending: Family Medicine | Admitting: Family Medicine

## 2017-09-18 DIAGNOSIS — Z1231 Encounter for screening mammogram for malignant neoplasm of breast: Secondary | ICD-10-CM | POA: Insufficient documentation

## 2017-09-18 DIAGNOSIS — Z1239 Encounter for other screening for malignant neoplasm of breast: Secondary | ICD-10-CM

## 2017-10-11 DIAGNOSIS — R69 Illness, unspecified: Secondary | ICD-10-CM | POA: Diagnosis not present

## 2017-12-02 ENCOUNTER — Ambulatory Visit: Payer: PPO | Admitting: Family Medicine

## 2017-12-05 ENCOUNTER — Ambulatory Visit: Payer: Medicare HMO

## 2017-12-10 ENCOUNTER — Telehealth: Payer: Self-pay

## 2017-12-10 DIAGNOSIS — M654 Radial styloid tenosynovitis [de Quervain]: Secondary | ICD-10-CM

## 2017-12-10 NOTE — Telephone Encounter (Signed)
If this is for the [employer] that I saw her for in July, then yes that's fine and I put in a referral to orthopaedics just now If it is for a different problem, then it's fine to enter new referral with her current symptoms

## 2017-12-10 NOTE — Telephone Encounter (Signed)
Copied from [place] [id number]. Topic: General - Other >> Dec 10, 2017  3:37 PM Keene Breath wrote: Reason for CRM: Patient called to ask the doctor about a cortisone shot or if she could refer her to someone that could give the shot.  Patient stated that her finger has not gotten better and she needs some relieve.

## 2017-12-10 NOTE — Telephone Encounter (Signed)
Pt notified same problem

## 2017-12-17 DIAGNOSIS — R69 Illness, unspecified: Secondary | ICD-10-CM | POA: Diagnosis not present

## 2017-12-17 DIAGNOSIS — M654 Radial styloid tenosynovitis [de Quervain]: Secondary | ICD-10-CM | POA: Diagnosis not present

## 2018-02-13 DIAGNOSIS — R69 Illness, unspecified: Secondary | ICD-10-CM | POA: Diagnosis not present

## 2018-02-14 ENCOUNTER — Ambulatory Visit (INDEPENDENT_AMBULATORY_CARE_PROVIDER_SITE_OTHER): Payer: Medicare HMO

## 2018-02-14 VITALS — BP 124/78 | HR 65 | Temp 98.1°F | Resp 16 | Ht 63.0 in | Wt 183.9 lb

## 2018-02-14 DIAGNOSIS — Z0000 Encounter for general adult medical examination without abnormal findings: Secondary | ICD-10-CM | POA: Diagnosis not present

## 2018-02-14 DIAGNOSIS — Z780 Asymptomatic menopausal state: Secondary | ICD-10-CM

## 2018-02-14 DIAGNOSIS — Z1211 Encounter for screening for malignant neoplasm of colon: Secondary | ICD-10-CM

## 2018-02-14 NOTE — Progress Notes (Signed)
Subjective:   Desiree Carpenter is a 68 y.o. female who presents for Medicare Annual (Subsequent) preventive examination.  Review of Systems:   Cardiac Risk Factors include: advanced age (>1men, >61 women);obesity (BMI >30kg/m2)     Objective:     Vitals: BP 124/78 (BP Location: Right Arm, Patient Position: Sitting, Cuff Size: Normal)   Pulse 65   Temp 98.1 F (36.7 C) (Oral)   Resp 16   Ht 5\' 3"  (1.6 m)   Wt 183 lb 14.4 oz (83.4 kg)   SpO2 98%   BMI 32.58 kg/m   Body mass index is 32.58 kg/m.  Advanced Directives 02/14/2018 02/28/2017 08/24/2016 03/28/2016 10/19/2014  Does Patient Have a Medical Advance Directive? Yes No No No No  Does patient want to make changes to medical advance directive? Yes (MAU/Ambulatory/Procedural Areas - Information given) - - - -    Tobacco Social History   Tobacco Use  Smoking Status Never Smoker  Smokeless Tobacco Never Used     Counseling given: Not Answered   Clinical Intake:  Pre-visit preparation completed: Yes  Pain : No/denies pain     Nutritional Status: BMI > 30  Obese Nutritional Risks: None Diabetes: No  How often do you need to have someone help you when you read instructions, pamphlets, or other written materials from your doctor or pharmacy?: 1 - Never What is the last grade level you completed in school?: bachelors degree  Interpreter Needed?: No  Information entered by :: Clemetine Marker LPN  Past Medical History:  Diagnosis Date  . Obesity 03/28/2016   Past Surgical History:  Procedure Laterality Date  . ABDOMINAL HYSTERECTOMY    . BUNIONECTOMY    . COLONOSCOPY WITH PROPOFOL N/A 02/28/2017   Procedure: COLONOSCOPY WITH PROPOFOL;  Surgeon: Jonathon Bellows, MD;  Location: [facility];  Service: Gastroenterology;  Laterality: N/A;   Family History  Problem Relation Age of Onset  . Cancer Mother        breast (66), colon (50)  . Breast cancer Mother 71  . Cancer Father 44       colon  . Cancer Maternal  Grandmother        breast  . Breast cancer Maternal Grandmother 73  . Heart disease Maternal Grandfather        heart failure  . Cancer Maternal Grandfather        lung  . Stroke Paternal Grandmother    Social History   Socioeconomic History  . Marital status: Married    Spouse name: Not on file  . Number of children: 1  . Years of education: Not on file  . Highest education level: Bachelor's degree (e.g., BA, AB, BS)  Occupational History  . Occupation: retired  Scientific laboratory technician  . Financial resource strain: Not hard at all  . Food insecurity:    Worry: Never true    Inability: Never true  . Transportation needs:    Medical: No    Non-medical: No  Tobacco Use  . Smoking status: Never Smoker  . Smokeless tobacco: Never Used  Substance and Sexual Activity  . Alcohol use: No    Alcohol/week: 0.0 standard drinks  . Drug use: No  . Sexual activity: Not Currently  Lifestyle  . Physical activity:    Days per week: 3 days    Minutes per session: 30 min  . Stress: Not at all  Relationships  . Social connections:    Talks on phone: More than three times a  week    Gets together: More than three times a week    Attends religious service: More than 4 times per year    Active member of club or organization: No    Attends meetings of clubs or organizations: Never    Relationship status: Married  Other Topics Concern  . Not on file  Social History Narrative   Pt has one child who lives in [place] and her mother recently came to live with her    Outpatient Encounter Medications as of 02/14/2018  Medication Sig  . aspirin EC 81 MG tablet Take 1 tablet (81 mg total) daily by mouth.  . meloxicam (MOBIC) 15 MG tablet Take 15 mg by mouth daily. PRN  . Multiple Vitamin (MULTIVITAMIN) tablet Take 1 tablet by mouth daily.  . [DISCONTINUED] cholecalciferol (VITAMIN D) 1000 units tablet Take 1,000 Units by mouth once a week.  . [DISCONTINUED] Elastic Bandages & Supports (WRIST  BRACE/LEFT MEDIUM) MISC Thumb spica splint for dequervains; NOT carpal tunnel; left wrist/hand   No facility-administered encounter medications on file as of 02/14/2018.     Activities of Daily Living In your present state of health, do you have any difficulty performing the following activities: 02/14/2018 08/13/2017  Hearing? N N  Comment declines hearing aids -  Vision? N N  Comment wears glasses -  Difficulty concentrating or making decisions? N N  Walking or climbing stairs? N N  Dressing or bathing? N N  Doing errands, shopping? N N  Preparing Food and eating ? N -  Using the Toilet? N -  In the past six months, have you accidently leaked urine? N -  Do you have problems with loss of bowel control? N -  Managing your Medications? N -  Managing your Finances? N -  Housekeeping or managing your Housekeeping? N -  Some recent data might be hidden    Patient Care Team: Lada, Satira Anis, MD as PCP - General (Family Medicine) Anell Barr, OD (Optometry)    Assessment:   This is a routine wellness examination for Desiree Carpenter.  Exercise Activities and Dietary recommendations Current Exercise Habits: Home exercise routine, Type of exercise: walking, Time (Minutes): 30, Frequency (Times/Week): 3, Weekly Exercise (Minutes/Week): 90, Intensity: Mild, Exercise limited by: None identified  Goals    . Increase physical activity     Pt would like to increase physical activity to 150 minutes per week    . Weight (lb) < 168 lb (76.2 kg)       Fall Risk Fall Risk  02/14/2018 08/13/2017 11/29/2016 08/24/2016 03/28/2016  Falls in the past year? 0 No No No No  Number falls in past yr: 0 - - - -  Risk for fall due to : - - - - -  Follow up Falls prevention discussed - - - -   FALL RISK PREVENTION PERTAINING TO THE HOME:  Any stairs in or around the home WITH handrails? No  Home free of loose throw rugs in walkways, pet beds, electrical cords, etc? Yes  Adequate lighting in your home to  reduce risk of falls? Yes   ASSISTIVE DEVICES UTILIZED TO PREVENT FALLS:  Life alert? No  Use of a cane, walker or w/c? No  Grab bars in the bathroom? Yes  Shower chair or bench in shower? No  Elevated toilet seat or a handicapped toilet? No   DME ORDERS:  DME order needed?  No   TIMED UP AND GO:  Was the test  performed? Yes .  Length of time to ambulate 10 feet: 5 sec.   GAIT:  Appearance of gait: Gait stead-fast and without the use of an assistive device.   Education: Fall risk prevention has been discussed.  Intervention(s) required? No   Depression Screen PHQ 2/9 Scores 02/14/2018 08/13/2017 11/29/2016 08/24/2016  PHQ - 2 Score 0 0 0 0     Cognitive Function     6CIT Screen 02/14/2018  What Year? 0 points  What month? 0 points  What time? 0 points  Count back from 20 0 points  Months in reverse 0 points  Repeat phrase 0 points  Total Score 0    Immunization History  Administered Date(s) Administered  . Influenza, High Dose Seasonal PF 10/11/2017  . Influenza,inj,Quad PF,6+ Mos 10/19/2014  . Influenza-Unspecified 10/11/2017  . Pneumococcal Conjugate-13 05/06/2017    Qualifies for Shingles Vaccine? Yes . Due for Shingrix. Education has been provided regarding the importance of this vaccine. Pt has been advised to call insurance company to determine out of pocket expense. Advised may also receive vaccine at local pharmacy or Health Dept. Verbalized acceptance and understanding.  Tdap: Up to date  Flu Vaccine:Up to date  Pneumococcal Vaccine: Due for Pneumococcal 15 May 2018. Screening Tests Health Maintenance  Topic Date Due  . MAMMOGRAM  03/21/2018  . PNA vac Low Risk Adult (2 of 2 - PPSV23) 05/07/2018  . TETANUS/TDAP  10/04/2020  . COLONOSCOPY  02/28/2022  . INFLUENZA VACCINE  Completed  . DEXA SCAN  Completed  . Hepatitis C Screening  Completed   Cancer Screenings:  Colorectal Screening: Completed 2013. Pt scheduled and prepared for colonoscopy  02/28/17 and per note solid stool in bowel making visualization difficult and pt requested to repeat in 2 weeks. Pt did not follow up. New referral to GI placed today. Pt aware the office will call re: appt.  Mammogram: Completed 09/18/17. Repeat every year.   Bone Density: Completed 2014. Results reflect NORMAL,  Repeat every 2 years. Ordered today. Pt provided with contact information and advised to call to schedule appt.   Lung Cancer Screening: (Low Dose CT Chest recommended if Age 16-80 years, 30 pack-year currently smoking OR have quit w/in 15years.) does not qualify.   Additional Screening:  Hepatitis C Screening: does qualify; Completed 03/28/16  Vision Screening: Recommended annual ophthalmology exams for early detection of glaucoma and other disorders of the eye. Is the patient up to date with their annual eye exam?  Yes  Who is the provider or what is the name of the office in which the pt attends annual eye exams? Dr. Ellin Mayhew  Dental Screening: Recommended annual dental exams for proper oral hygiene  Community Resource Referral:  CRR required this visit?  No      Plan:     I have personally reviewed and addressed the Medicare Annual Wellness questionnaire and have noted the following in the patient's chart:  A. Medical and social history B. Use of alcohol, tobacco or illicit drugs  C. Current medications and supplements D. Functional ability and status E.  Nutritional status F.  Physical activity G. Advance directives H. List of other physicians I.  Hospitalizations, surgeries, and ER visits in previous 12 months J.  [place] such as hearing and vision if needed, cognitive and depression L. Referrals and appointments   In addition, I have reviewed and discussed with patient certain preventive protocols, quality metrics, and best practice recommendations. A written personalized care plan for  preventive services as well as general preventive health  recommendations were provided to patient.   Signed,  Clemetine Marker, LPN Nurse Health Advisor   Nurse Notes: pt doing well overall and appreciative of visit today.Her mother was recently diagnosed with diabetes and their family has initiated a change in lifestyle to increase wellness. Pt plans to improve diet and exercise for herself as well.

## 2018-02-14 NOTE — Patient Instructions (Signed)
Desiree Carpenter , Thank you for taking time to come for your Medicare Wellness Visit. I appreciate your ongoing commitment to your health goals. Please review the following plan we discussed and let me know if I can assist you in the future.   Screening recommendations/referrals: Colonoscopy: Referral sent to GI for repeat colonoscopy. They will contact you for an appointment.  Mammogram: done 09/18/17. Repeat every year. Bone Density: done 2014. Please call [phone] to schedule your bone density exam.  Recommended yearly ophthalmology/optometry visit for glaucoma screening and checkup Recommended yearly dental visit for hygiene and checkup  Vaccinations: Influenza vaccine: done 10/11/17 Pneumococcal vaccine: done 05/06/17. Second dose due April 2020 Tdap vaccine: done 10/05/10 Shingles vaccine: Shingrix discussed. Please contact your pharmacy for coverage information.   Advanced directives: Advance directive discussed with you today. I have provided a copy for you to complete at home and have notarized. Once this is complete please bring a copy in to our office so we can scan it into your chart.  Conditions/risks identified: Recommend increasing physical activity to 150 minutes per week.   Next appointment: Please follow up in one year for your Medicare Annual Wellness visit.     Preventive Care 13 Years and Older, Female Preventive care refers to lifestyle choices and visits with your health care provider that can promote health and wellness. What does preventive care include?  A yearly physical exam. This is also called an annual well check.  Dental exams once or twice a year.  Routine eye exams. Ask your health care provider how often you should have your eyes checked.  Personal lifestyle choices, including:  Daily care of your teeth and gums.  Regular physical activity.  Eating a healthy diet.  Avoiding tobacco and drug use.  Limiting alcohol use.  Practicing safe  sex.  Taking low-dose aspirin every day.  Taking vitamin and mineral supplements as recommended by your health care provider. What happens during an annual well check? The services and screenings done by your health care provider during your annual well check will depend on your age, overall health, lifestyle risk factors, and family history of disease. Counseling  Your health care provider may ask you questions about your:  Alcohol use.  Tobacco use.  Drug use.  Emotional well-being.  Home and relationship well-being.  Sexual activity.  Eating habits.  History of falls.  Memory and ability to understand (cognition).  Work and work Statistician.  Reproductive health. Screening  You may have the following tests or measurements:  Height, weight, and BMI.  Blood pressure.  Lipid and cholesterol levels. These may be checked every 5 years, or more frequently if you are over 24 years old.  Skin check.  Lung cancer screening. You may have this screening every year starting at age 44 if you have a 30-pack-year history of smoking and currently smoke or have quit within the past 15 years.  Fecal occult blood test (FOBT) of the stool. You may have this test every year starting at age 20.  Flexible sigmoidoscopy or colonoscopy. You may have a sigmoidoscopy every 5 years or a colonoscopy every 10 years starting at age 52.  Hepatitis C blood test.  Hepatitis B blood test.  Sexually transmitted disease (STD) testing.  Diabetes screening. This is done by checking your blood sugar (glucose) after you have not eaten for a while (fasting). You may have this done every 1-3 years.  Bone density scan. This is done to screen for osteoporosis. You may  have this done starting at age 13.  Mammogram. This may be done every 1-2 years. Talk to your health care provider about how often you should have regular mammograms. Talk with your health care provider about your test results,  treatment options, and if necessary, the need for more tests. Vaccines  Your health care provider may recommend certain vaccines, such as:  Influenza vaccine. This is recommended every year.  Tetanus, diphtheria, and acellular pertussis (Tdap, Td) vaccine. You may need a Td booster every 10 years.  Zoster vaccine. You may need this after age 80.  Pneumococcal 13-valent conjugate (PCV13) vaccine. One dose is recommended after age 63.  Pneumococcal polysaccharide (PPSV23) vaccine. One dose is recommended after age 70. Talk to your health care provider about which screenings and vaccines you need and how often you need them. This information is not intended to replace advice given to you by your health care provider. Make sure you discuss any questions you have with your health care provider. Document Released: 02/04/2015 Document Revised: 09/28/2015 Document Reviewed: 11/09/2014 Elsevier Interactive Patient Education  2017 [place] Prevention in the Home Falls can cause injuries. They can happen to people of all ages. There are many things you can do to make your home safe and to help prevent falls. What can I do on the outside of my home?  Regularly fix the edges of walkways and driveways and fix any cracks.  Remove anything that might make you trip as you walk through a door, such as a raised step or threshold.  Trim any bushes or trees on the path to your home.  Use bright outdoor lighting.  Clear any walking paths of anything that might make someone trip, such as rocks or tools.  Regularly check to see if handrails are loose or broken. Make sure that both sides of any steps have handrails.  Any raised decks and porches should have guardrails on the edges.  Have any leaves, snow, or ice cleared regularly.  Use sand or salt on walking paths during winter.  Clean up any spills in your garage right away. This includes oil or grease spills. What can I do in the  bathroom?  Use night lights.  Install grab bars by the toilet and in the tub and shower. Do not use towel bars as grab bars.  Use non-skid mats or decals in the tub or shower.  If you need to sit down in the shower, use a plastic, non-slip stool.  Keep the floor dry. Clean up any water that spills on the floor as soon as it happens.  Remove soap buildup in the tub or shower regularly.  Attach bath mats securely with double-sided non-slip rug tape.  Do not have throw rugs and other things on the floor that can make you trip. What can I do in the bedroom?  Use night lights.  Make sure that you have a light by your bed that is easy to reach.  Do not use any sheets or blankets that are too big for your bed. They should not hang down onto the floor.  Have a firm chair that has side arms. You can use this for support while you get dressed.  Do not have throw rugs and other things on the floor that can make you trip. What can I do in the kitchen?  Clean up any spills right away.  Avoid walking on wet floors.  Keep items that you use a lot in  easy-to-reach places.  If you need to reach something above you, use a strong step stool that has a grab bar.  Keep electrical cords out of the way.  Do not use floor polish or wax that makes floors slippery. If you must use wax, use non-skid floor wax.  Do not have throw rugs and other things on the floor that can make you trip. What can I do with my stairs?  Do not leave any items on the stairs.  Make sure that there are handrails on both sides of the stairs and use them. Fix handrails that are broken or loose. Make sure that handrails are as long as the stairways.  Check any carpeting to make sure that it is firmly attached to the stairs. Fix any carpet that is loose or worn.  Avoid having throw rugs at the top or bottom of the stairs. If you do have throw rugs, attach them to the floor with carpet tape.  Make sure that you have a  light switch at the top of the stairs and the bottom of the stairs. If you do not have them, ask someone to add them for you. What else can I do to help prevent falls?  Wear shoes that:  Do not have high heels.  Have rubber bottoms.  Are comfortable and fit you well.  Are closed at the toe. Do not wear sandals.  If you use a stepladder:  Make sure that it is fully opened. Do not climb a closed stepladder.  Make sure that both sides of the stepladder are locked into place.  Ask someone to hold it for you, if possible.  Clearly mark and make sure that you can see:  Any grab bars or handrails.  First and last steps.  Where the edge of each step is.  Use tools that help you move around (mobility aids) if they are needed. These include:  Canes.  Walkers.  Scooters.  Crutches.  Turn on the lights when you go into a dark area. Replace any light bulbs as soon as they burn out.  Set up your furniture so you have a clear path. Avoid moving your furniture around.  If any of your floors are uneven, fix them.  If there are any pets around you, be aware of where they are.  Review your medicines with your doctor. Some medicines can make you feel dizzy. This can increase your chance of falling. Ask your doctor what other things that you can do to help prevent falls. This information is not intended to replace advice given to you by your health care provider. Make sure you discuss any questions you have with your health care provider. Document Released: 11/04/2008 Document Revised: 06/16/2015 Document Reviewed: 02/12/2014 Elsevier Interactive Patient Education  2017 [employer].

## 2018-02-18 ENCOUNTER — Other Ambulatory Visit: Payer: Self-pay

## 2018-02-18 DIAGNOSIS — Z1211 Encounter for screening for malignant neoplasm of colon: Secondary | ICD-10-CM

## 2018-02-18 MED ORDER — NA SULFATE-K SULFATE-MG SULF 17.5-3.13-1.6 GM/177ML PO SOLN: 1.0000 | Freq: Once | ORAL | 0 refills | Status: AC

## 2018-02-25 ENCOUNTER — Telehealth: Payer: Self-pay | Admitting: Gastroenterology

## 2018-02-25 NOTE — Telephone Encounter (Signed)
Patient called and needs another prescription called into the [facility] pharmacy in [place] for her colonoscopy on 03-21-2018 with Dr Allen Norris that is less expensive than the Su-prep.

## 2018-02-26 ENCOUNTER — Other Ambulatory Visit: Payer: Self-pay

## 2018-02-26 MED ORDER — PEG 3350-KCL-NABCB-NACL-NASULF 236 G PO SOLR: 4000.0000 mL | Freq: Once | ORAL | 0 refills | Status: AC

## 2018-02-26 NOTE — Telephone Encounter (Signed)
rx has been changed to [facility] and sent to pharmacy electronically.  Thanks [employer]

## 2018-02-27 ENCOUNTER — Encounter: Payer: Self-pay | Admitting: Family Medicine

## 2018-02-27 ENCOUNTER — Other Ambulatory Visit: Payer: Self-pay | Admitting: Family Medicine

## 2018-02-27 ENCOUNTER — Ambulatory Visit (INDEPENDENT_AMBULATORY_CARE_PROVIDER_SITE_OTHER): Payer: Medicare HMO | Admitting: Family Medicine

## 2018-02-27 VITALS — BP 126/76 | HR 67 | Temp 98.3°F | Resp 12 | Ht 63.0 in | Wt 182.0 lb

## 2018-02-27 DIAGNOSIS — Z5181 Encounter for therapeutic drug level monitoring: Secondary | ICD-10-CM | POA: Diagnosis not present

## 2018-02-27 DIAGNOSIS — D691 Qualitative platelet defects: Secondary | ICD-10-CM

## 2018-02-27 DIAGNOSIS — D1722 Benign lipomatous neoplasm of skin and subcutaneous tissue of left arm: Secondary | ICD-10-CM | POA: Diagnosis not present

## 2018-02-27 DIAGNOSIS — Z6832 Body mass index (BMI) 32.0-32.9, adult: Secondary | ICD-10-CM

## 2018-02-27 DIAGNOSIS — M170 Bilateral primary osteoarthritis of knee: Secondary | ICD-10-CM | POA: Diagnosis not present

## 2018-02-27 DIAGNOSIS — E785 Hyperlipidemia, unspecified: Secondary | ICD-10-CM | POA: Insufficient documentation

## 2018-02-27 DIAGNOSIS — E6609 Other obesity due to excess calories: Secondary | ICD-10-CM | POA: Diagnosis not present

## 2018-02-27 HISTORY — DX: Bilateral primary osteoarthritis of knee: M17.0

## 2018-02-27 HISTORY — DX: Hyperlipidemia, unspecified: E78.5

## 2018-02-27 NOTE — Assessment & Plan Note (Addendum)
Encouraged weight loss; she declined new nutrition referral, piggyback on with mother; stay well-hydrated, walking, etc.

## 2018-02-27 NOTE — Patient Instructions (Addendum)
Try a low residue diet for a few days prior to your colonoscopy prep  Try turmeric as a natural anti-inflammatory (for pain and arthritis). It comes in capsules where you buy aspirin and fish oil, but also as a spice where you buy pepper and garlic powder.  Check out the information at familydoctor.org entitled "Nutrition for Weight Loss: What You Need to Know about Fad Diets" Try to lose between 1-2 pounds per week by taking in fewer calories and burning off more calories You can succeed by limiting portions, limiting foods dense in calories and fat, becoming more active, and drinking 8 glasses of water a day (64 ounces) Don't skip meals, especially breakfast, as skipping meals may alter your metabolism Do not use over-the-counter weight loss pills or gimmicks that claim rapid weight loss A healthy BMI (or body mass index) is between 18.5 and 24.9 You can calculate your ideal BMI at the [place] website ClubMonetize.fr   Obesity, Adult Obesity is the condition of having too much total body fat. Being overweight or obese means that your weight is greater than what is considered healthy for your body size. Obesity is determined by a measurement called BMI. BMI is an estimate of body fat and is calculated from height and weight. For adults, a BMI of 30 or higher is considered obese. Obesity can eventually lead to other health concerns and major illnesses, including:  Stroke.  Coronary artery disease (CAD).  Type 2 diabetes.  Some types of cancer, including cancers of the colon, breast, uterus, and gallbladder.  Osteoarthritis.  High blood pressure (hypertension).  High cholesterol.  Sleep apnea.  Gallbladder stones.  Infertility problems. What are the causes? The main cause of obesity is taking in (consuming) more calories than your body uses for energy. Other factors that contribute to this condition may include:  Being born with  genes that make you more likely to become obese.  Having a medical condition that causes obesity. These conditions include: ? Hypothyroidism. ? Polycystic ovarian syndrome (PCOS). ? Binge-eating disorder. ? Cushing syndrome.  Taking certain medicines, such as steroids, antidepressants, and seizure medicines.  Not being physically active (sedentary lifestyle).  Living where there are limited places to exercise safely or buy healthy foods.  Not getting enough sleep. What increases the risk? The following factors may increase your risk of this condition:  Having a family history of obesity.  Being a woman of African-American descent.  Being a man of Hispanic descent. What are the signs or symptoms? Having excessive body fat is the main symptom of this condition. How is this diagnosed? This condition may be diagnosed based on:  Your symptoms.  Your medical history.  A physical exam. Your health care provider may measure: ? Your BMI. If you are an adult with a BMI between 25 and less than 30, you are considered overweight. If you are an adult with a BMI of 30 or higher, you are considered obese. ? The distances around your hips and your waist (circumferences). These may be compared to each other to help diagnose your condition. ? Your skinfold thickness. Your health care provider may gently pinch a fold of your skin and measure it. How is this treated? Treatment for this condition often includes changing your lifestyle. Treatment may include some or all of the following:  Dietary changes. Work with your health care provider and a dietitian to set a weight-loss goal that is healthy and reasonable for you. Dietary changes may include eating: ? Smaller  portions. A portion size is the amount of a particular food that is healthy for you to eat at one time. This varies from person to person. ? Low-calorie or low-fat options. ? More whole grains, fruits, and vegetables.  Regular  physical activity. This may include aerobic activity (cardio) and strength training.  Medicine to help you lose weight. Your health care provider may prescribe medicine if you are unable to lose 1 pound a week after 6 weeks of eating more healthily and doing more physical activity.  Surgery. Surgical options may include gastric banding and gastric bypass. Surgery may be done if: ? Other treatments have not helped to improve your condition. ? You have a BMI of 40 or higher. ? You have life-threatening health problems related to obesity. Follow these instructions at home:  Eating and drinking   Follow recommendations from your health care provider about what you eat and drink. Your health care provider may advise you to: ? Limit fast foods, sweets, and processed snack foods. ? Choose low-fat options, such as low-fat milk instead of whole milk. ? Eat 5 or more servings of fruits or vegetables every day. ? Eat at home more often. This gives you more control over what you eat. ? Choose healthy foods when you eat out. ? Learn what a healthy portion size is. ? Keep low-fat snacks on hand. ? Avoid sugary drinks, such as soda, fruit juice, iced tea sweetened with sugar, and flavored milk. ? Eat a healthy breakfast.  Drink enough water to keep your urine clear or pale yellow.  Do not go without eating for long periods of time (do not fast) or follow a fad diet. Fasting and fad diets can be unhealthy and even dangerous. Physical Activity  Exercise regularly, as told by your health care provider. Ask your health care provider what types of exercise are safe for you and how often you should exercise.  Warm up and stretch before being active.  Cool down and stretch after being active.  Rest between periods of activity. Lifestyle  Limit the time that you spend in front of your TV, computer, or video game system.  Find ways to reward yourself that do not involve food.  Limit alcohol intake  to no more than 1 drink a day for nonpregnant women and 2 drinks a day for men. One drink equals 12 oz of beer, 5 oz of wine, or 1 oz of hard liquor. General instructions  Keep a weight loss journal to keep track of the food you eat and how much you exercise you get.  Take over-the-counter and prescription medicines only as told by your health care provider.  Take vitamins and supplements only as told by your health care provider.  Consider joining a support group. Your health care provider may be able to recommend a support group.  Keep all follow-up visits as told by your health care provider. This is important. Contact a health care provider if:  You are unable to meet your weight loss goal after 6 weeks of dietary and lifestyle changes. This information is not intended to replace advice given to you by your health care provider. Make sure you discuss any questions you have with your health care provider. Document Released: 02/16/2004 Document Revised: 06/13/2015 Document Reviewed: 10/27/2014 Elsevier Interactive Patient Education  2019 Elsevier Inc.  Preventing Unhealthy [employer], Adult Staying at a healthy weight is important to your overall health. When fat builds up in your body, you may become  overweight or obese. Being overweight or obese increases your risk of developing certain health problems, such as heart disease, diabetes, sleeping problems, joint problems, and some types of cancer. Unhealthy weight gain is often the result of making unhealthy food choices or not getting enough exercise. You can make changes to your lifestyle to prevent obesity and stay as healthy as possible. What nutrition changes can be made?   Eat only as much as your body needs. To do this: ? Pay attention to signs that you are hungry or full. Stop eating as soon as you feel full. ? If you feel hungry, try drinking water first before eating. Drink enough water so your urine is clear or pale  yellow. ? Eat smaller portions. Pay attention to portion sizes when eating out. ? Look at serving sizes on food labels. Most foods contain more than one serving per container. ? Eat the recommended number of calories for your gender and activity level. For most active people, a daily total of 2,000 calories is appropriate. If you are trying to lose weight or are not very active, you may need to eat fewer calories. Talk with your health care provider or a diet and nutrition specialist (dietitian) about how many calories you need each day.  Choose healthy foods, such as: ? Fruits and vegetables. At each meal, try to fill at least half of your plate with fruits and vegetables. ? Whole grains, such as whole-wheat bread, brown rice, and quinoa. ? Lean meats, such as chicken or fish. ? Other healthy proteins, such as beans, eggs, or tofu. ? Healthy fats, such as nuts, seeds, fatty fish, and olive oil. ? Low-fat or fat-free dairy products.  Check food labels, and avoid food and drinks that: ? Are high in calories. ? Have added sugar. ? Are high in sodium. ? Have saturated fats or trans fats.  Cook foods in healthier ways, such as by baking, broiling, or grilling.  Make a meal plan for the week, and shop with a grocery list to help you stay on track with your purchases. Try to avoid going to the grocery store when you are hungry.  When grocery shopping, try to shop around the outside of the store first, where the fresh foods are. Doing this helps you to avoid prepackaged foods, which can be high in sugar, salt (sodium), and fat. What lifestyle changes can be made?   Exercise for 30 or more minutes on 5 or more days each week. Exercising may include brisk walking, yard work, biking, running, swimming, and team sports like basketball and soccer. Ask your health care provider which exercises are safe for you.  Do muscle-strengthening activities, such as lifting weights or using resistance bands, on  2 or more days a week.  Do not use any products that contain nicotine or tobacco, such as cigarettes and e-cigarettes. If you need help quitting, ask your health care provider.  Limit alcohol intake to no more than 1 drink a day for nonpregnant women and 2 drinks a day for men. One drink equals 12 oz of beer, 5 oz of wine, or 1 oz of hard liquor.  Try to get 7-9 hours of sleep each night. What other changes can be made?  Keep a food and activity journal to keep track of: ? What you ate and how many calories you had. Remember to count the calories in sauces, dressings, and side dishes. ? Whether you were active, and what exercises you did. ? Your  calorie, weight, and activity goals.  Check your weight regularly. Track any changes. If you notice you have gained weight, make changes to your diet or activity routine.  Avoid taking weight-loss medicines or supplements. Talk to your health care provider before starting any new medicine or supplement.  Talk to your health care provider before trying any new diet or exercise plan. Why are these changes important? Eating healthy, staying active, and having healthy habits can help you to prevent obesity. Those changes also:  Help you manage stress and emotions.  Help you connect with friends and family.  Improve your self-esteem.  Improve your sleep.  Prevent long-term health problems. What can happen if changes are not made? Being obese or overweight can cause you to develop joint or bone problems, which can make it hard for you to stay active or do activities you enjoy. Being obese or overweight also puts stress on your heart and lungs and can lead to health problems like diabetes, heart disease, and some cancers. Where to find more information Talk with your health care provider or a dietitian about healthy eating and healthy lifestyle choices. You may also find information from:  U.[place]. Department of Agriculture, MyPlate:  FormerBoss.no  American Heart Association: www.heart.org  Centers for Disease Control and Prevention: http://www.wolf.info/ Summary  Staying at a healthy weight is important to your overall health. It helps you to prevent certain diseases and health problems, such as heart disease, diabetes, joint problems, sleep disorders, and some types of cancer.  Being obese or overweight can cause you to develop joint or bone problems, which can make it hard for you to stay active or do activities you enjoy.  You can prevent unhealthy weight gain by eating a healthy diet, exercising regularly, not smoking, limiting alcohol, and getting enough sleep.  Talk with your health care provider or a dietitian for guidance about healthy eating and healthy lifestyle choices. This information is not intended to replace advice given to you by your health care provider. Make sure you discuss any questions you have with your health care provider. Document Released: 01/10/2016 Document Revised: 10/19/2016 Document Reviewed: 02/15/2016 Elsevier Interactive Patient Education  2019 [employer].

## 2018-02-27 NOTE — Assessment & Plan Note (Signed)
Weight loss encouraged; meloxicam occasionally; xrays not needed; turmeric recommended

## 2018-02-27 NOTE — Progress Notes (Signed)
Greetings. It was a pleasure to see you today. Your complete blood count is normal except for an elevated mean platelet volume. I'm going to add on a peripheral smear and a thyroid test. You have normal numbers of white blood cells, red blood cells, and platelets. The other labs are pending. Peace, Dr. Sanda Klein

## 2018-02-27 NOTE — Assessment & Plan Note (Signed)
Check liver and kidneys and CBC on the meloxicam

## 2018-02-27 NOTE — Progress Notes (Signed)
Desiree Carpenter, please ADD ON the additional labs for this patient (already ordered)

## 2018-02-27 NOTE — Progress Notes (Signed)
BP 126/76   Pulse 67   Temp 98.3 F (36.8 C) (Oral)   Resp 12   Ht 5\' 3"  (1.6 m)   Wt 182 lb (82.6 kg)   SpO2 97%   BMI 32.24 kg/m    Subjective:    Patient ID: Desiree Carpenter, female    DOB: 08-02-1950, 68 y.o.   MRN: [id number]  HPI: Desiree Carpenter is a 68 y.o. female  Chief Complaint  Patient presents with  . Follow-up    HPI Here for f/u No trips to ER She had her medicare wellness visit She has not had labs in over a year Mother and grandmother had breast cancer; mother is still living Colon cancer runs in the family; scheduled for colonoscopy later this month She has meloxicam on her med list for arthritis pain; just once in a while; knees might start hurting if walking She takes aspirin; no bleeding from nose, gums, urine, stool Obesity; down 4+ pounds; walking more; her ideal weight would be 150 pounds She thinks she can reach that next year Going to the gym soon, walking 1 mile a day (no chest pain), will increase gradually to 2 miles She had her DeQuervain's tenosynovitis injected by ortho and there is now a lump there, right radial wrist She has noticed some fat accumulation ovr the extensor surface LEFT distal forearm; a little bit on the right but more noticeable on the left; nontender, no hard areas  Depression screen [facility] 2/9 02/27/2018 02/14/2018 08/13/2017 11/29/2016 08/24/2016  Decreased Interest 0 0 0 0 0  Down, Depressed, Hopeless 0 0 0 0 0  PHQ - 2 Score 0 0 0 0 0  Altered sleeping 0 - - - -  Tired, decreased energy 0 - - - -  Change in appetite 0 - - - -  Feeling bad or failure about yourself  0 - - - -  Trouble concentrating 0 - - - -  Moving slowly or fidgety/restless 0 - - - -  Suicidal thoughts 0 - - - -  PHQ-9 Score 0 - - - -  Difficult doing work/chores Not difficult at all - - - -   Fall Risk  02/27/2018 02/14/2018 08/13/2017 11/29/2016 08/24/2016  Falls in the past year? 0 0 No No No  Number falls in past yr: 0 0 - - -  Injury with  Fall? 0 - - - -  Risk for fall due to : - - - - -  Follow up - Falls prevention discussed - - -    Relevant past medical, surgical, family and social history reviewed Past Medical History:  Diagnosis Date  . Arthritis of both knees 02/27/2018  . Mild hyperlipidemia 02/27/2018  . Obesity 03/28/2016   Past Surgical History:  Procedure Laterality Date  . ABDOMINAL HYSTERECTOMY    . BUNIONECTOMY    . COLONOSCOPY WITH PROPOFOL N/A 02/28/2017   Procedure: COLONOSCOPY WITH PROPOFOL;  Surgeon: Jonathon Bellows, MD;  Location: [facility] ENDOSCOPY;  Service: Gastroenterology;  Laterality: N/A;   Family History  Problem Relation Age of Onset  . Cancer Mother        breast (22), colon (43)  . Breast cancer Mother 19  . Cancer Father 52       colon  . Cancer Maternal Grandmother        breast  . Breast cancer Maternal Grandmother 19  . Heart disease Maternal Grandfather        heart failure  .  Cancer Maternal Grandfather        lung  . Stroke Paternal Grandmother    Social History   Tobacco Use  . Smoking status: Never Smoker  . Smokeless tobacco: Never Used  Substance Use Topics  . Alcohol use: No    Alcohol/week: 0.0 standard drinks  . Drug use: No     [facility] Visit from 02/27/2018 in [facility]  AUDIT-C Score  0      Interim medical history since last visit reviewed. Allergies and medications reviewed  Review of Systems Per HPI unless specifically indicated above     Objective:    BP 126/76   Pulse 67   Temp 98.3 F (36.8 C) (Oral)   Resp 12   Ht 5\' 3"  (1.6 m)   Wt 182 lb (82.6 kg)   SpO2 97%   BMI 32.24 kg/m   Wt Readings from Last 3 Encounters:  02/27/18 182 lb (82.6 kg)  02/14/18 183 lb 14.4 oz (83.4 kg)  08/13/17 186 lb 6.4 oz (84.6 kg)    Physical Exam Musculoskeletal:     Left hand: She exhibits deformity (nodule over left distal radial wrist).       Hands:  Skin:         Comments: Fatty accumulation over extensor surface LEFT distal  forearm     Results for orders placed or performed in visit on 03/28/16  Hemoglobin A1c  Result Value Ref Range   Hemoglobin A1C 5.5       Assessment & Plan:   Problem List Items Addressed This Visit      Musculoskeletal and Integument   Arthritis of both knees - Primary (Chronic)    Weight loss encouraged; meloxicam occasionally; xrays not needed; turmeric recommended        Other   Obesity (Chronic)    Encouraged weight loss; she declined new nutrition referral, piggyback on with mother; stay well-hydrated, walking, etc.      Mild hyperlipidemia    Reviewed last lipids with her; LDL just over 100, but excellent HDL; will check since obese      Relevant Orders   Lipid panel   Medication monitoring encounter    Check liver and kidneys and CBC on the meloxicam      Relevant Orders   CBC   COMPLETE METABOLIC PANEL WITH GFR    Other Visit Diagnoses    Lipoma of left upper extremity       suspect lipoma, though could just be unequal distribution of fatty tissue w/obesity; watch; offered [place] but she politely declined, will watch, let me know change       Follow up plan: Return in about 1 year (around 02/28/2019) for follow-up visit with Dr. Sanda Klein.  An after-visit summary was printed and given to the patient at [facility] [place].  Please see the patient instructions which may contain other information and recommendations beyond what is mentioned above in the assessment and plan.  No orders of the defined types were placed in this encounter.   Orders Placed This Encounter  Procedures  . CBC  . COMPLETE METABOLIC PANEL WITH GFR  . Lipid panel

## 2018-02-27 NOTE — Assessment & Plan Note (Signed)
Reviewed last lipids with her; LDL just over 100, but excellent HDL; will check since obese

## 2018-02-28 ENCOUNTER — Other Ambulatory Visit: Payer: Self-pay | Admitting: Family Medicine

## 2018-02-28 ENCOUNTER — Other Ambulatory Visit: Payer: Self-pay

## 2018-02-28 DIAGNOSIS — E785 Hyperlipidemia, unspecified: Secondary | ICD-10-CM

## 2018-02-28 DIAGNOSIS — D691 Qualitative platelet defects: Secondary | ICD-10-CM

## 2018-02-28 MED ORDER — ATORVASTATIN CALCIUM 10 MG PO TABS: 10.0000 mg | ORAL_TABLET | Freq: Every day | ORAL | 1 refills | Status: DC

## 2018-02-28 NOTE — Progress Notes (Signed)
mychart message Recommended lipitor 10 mg Recheck labs in 6 weeks, ordered

## 2018-03-04 LAB — PATHOLOGIST SMEAR REVIEW

## 2018-03-04 LAB — COMPLETE METABOLIC PANEL WITH GFR
AG Ratio: 1.7 (calc) (ref 1.0–2.5)
ALBUMIN MSPROF: 4.4 g/dL (ref 3.6–5.1)
ALT: 20 U/L (ref 6–29)
AST: 23 U/L (ref 10–35)
Alkaline phosphatase (APISO): 58 U/L (ref 37–153)
BUN: 18 mg/dL (ref 7–25)
CO2: 28 mmol/L (ref 20–32)
Calcium: 10.1 mg/dL (ref 8.6–10.4)
Chloride: 105 mmol/L (ref 98–110)
Creat: 0.86 mg/dL (ref 0.50–0.99)
GFR, EST NON AFRICAN AMERICAN: 70 mL/min/{1.73_m2} (ref >=60)
GFR, Est African American: 82 mL/min/{1.73_m2} (ref >=60)
Globulin: 2.6 g/dL (calc) (ref 1.9–3.7)
Glucose, Bld: 85 mg/dL (ref 65–99)
Potassium: 4.8 mmol/L (ref 3.5–5.3)
Sodium: 141 mmol/L (ref 135–146)
Total Bilirubin: 0.5 mg/dL (ref 0.2–1.2)
Total Protein: 7 g/dL (ref 6.1–8.1)

## 2018-03-04 LAB — CBC
HCT: 42.3 % (ref 35.0–45.0)
HEMOGLOBIN: 13.7 g/dL (ref 11.7–15.5)
MCH: 27.5 pg (ref 27.0–33.0)
MCHC: 32.4 g/dL (ref 32.0–36.0)
MCV: 84.9 fL (ref 80.0–100.0)
MPV: 13.4 fL — ABNORMAL HIGH (ref 7.5–12.5)
Platelets: 211 10*3/uL (ref 140–400)
RBC: 4.98 10*6/uL (ref 3.80–5.10)
RDW: 12.5 % (ref 11.0–15.0)
WBC: 4.1 10*3/uL (ref 3.8–10.8)

## 2018-03-04 LAB — LIPID PANEL
Cholesterol: 207 mg/dL — ABNORMAL HIGH (ref <200)
HDL: 65 mg/dL (ref >=50)
LDL Cholesterol (Calc): 126 mg/dL (calc) — ABNORMAL HIGH
Non-HDL Cholesterol (Calc): 142 mg/dL (calc) — ABNORMAL HIGH (ref <130)
Total CHOL/HDL Ratio: 3.2 (calc) (ref <5.0)
Triglycerides: 64 mg/dL (ref <150)

## 2018-03-04 LAB — TEST AUTHORIZATION 2

## 2018-03-04 LAB — TEST AUTHORIZATION

## 2018-03-04 LAB — TSH: TSH: 1.73 mIU/L (ref 0.40–4.50)

## 2018-03-04 LAB — T4, FREE: Free T4: 1.1 ng/dL (ref 0.8–1.8)

## 2018-03-05 ENCOUNTER — Other Ambulatory Visit: Payer: Self-pay | Admitting: Family Medicine

## 2018-03-05 DIAGNOSIS — D691 Qualitative platelet defects: Secondary | ICD-10-CM

## 2018-03-05 DIAGNOSIS — D581 Hereditary elliptocytosis: Secondary | ICD-10-CM

## 2018-03-05 NOTE — Progress Notes (Signed)
Refer to heme

## 2018-03-10 ENCOUNTER — Encounter: Payer: Self-pay | Admitting: *Deleted

## 2018-03-10 ENCOUNTER — Other Ambulatory Visit: Payer: Self-pay

## 2018-03-11 SURGERY — COLONOSCOPY WITH PROPOFOL
Anesthesia: General

## 2018-03-14 ENCOUNTER — Inpatient Hospital Stay: Payer: Medicare HMO | Admitting: Oncology

## 2018-03-17 ENCOUNTER — Other Ambulatory Visit: Payer: Self-pay

## 2018-03-17 ENCOUNTER — Inpatient Hospital Stay: Payer: Medicare HMO | Attending: Oncology | Admitting: Oncology

## 2018-03-17 ENCOUNTER — Encounter: Payer: Self-pay | Admitting: Oncology

## 2018-03-17 ENCOUNTER — Inpatient Hospital Stay: Payer: Medicare HMO

## 2018-03-17 VITALS — BP 118/74 | HR 62 | Temp 96.8°F | Resp 18 | Ht 65.35 in | Wt 181.4 lb

## 2018-03-17 DIAGNOSIS — R799 Abnormal finding of blood chemistry, unspecified: Secondary | ICD-10-CM

## 2018-03-17 LAB — RETICULOCYTES
IMMATURE RETIC FRACT: 5 % (ref 2.3–15.9)
RBC.: 4.6 MIL/uL (ref 3.87–5.11)
Retic Count, Absolute: 53.8 10*3/uL (ref 19.0–186.0)
Retic Ct Pct: 1.2 % (ref 0.4–3.1)

## 2018-03-17 LAB — CBC WITH DIFFERENTIAL/PLATELET
Abs Immature Granulocytes: 0 10*3/uL (ref 0.00–0.07)
BASOS ABS: 0 10*3/uL (ref 0.0–0.1)
Basophils Relative: 0 %
Eosinophils Absolute: 0.1 10*3/uL (ref 0.0–0.5)
Eosinophils Relative: 1 %
HCT: 40.6 % (ref 36.0–46.0)
Hemoglobin: 12.8 g/dL (ref 12.0–15.0)
Immature Granulocytes: 0 %
LYMPHS PCT: 35 %
Lymphs Abs: 1.6 10*3/uL (ref 0.7–4.0)
MCH: 27.8 pg (ref 26.0–34.0)
MCHC: 31.5 g/dL (ref 30.0–36.0)
MCV: 88.3 fL (ref 80.0–100.0)
Monocytes Absolute: 0.4 10*3/uL (ref 0.1–1.0)
Monocytes Relative: 8 %
NRBC: 0 % (ref 0.0–0.2)
Neutro Abs: 2.6 10*3/uL (ref 1.7–7.7)
Neutrophils Relative %: 56 %
Platelets: 220 10*3/uL (ref 150–400)
RBC: 4.6 MIL/uL (ref 3.87–5.11)
RDW: 12.3 % (ref 11.5–15.5)
WBC: 4.7 10*3/uL (ref 4.0–10.5)

## 2018-03-17 LAB — IRON AND TIBC
Iron: 96 ug/dL (ref 28–170)
Saturation Ratios: 28 % (ref 10.4–31.8)
TIBC: 340 ug/dL (ref 250–450)
UIBC: 244 ug/dL

## 2018-03-17 LAB — LACTATE DEHYDROGENASE: LDH: 140 U/L (ref 98–192)

## 2018-03-17 LAB — FERRITIN: Ferritin: 99 ng/mL (ref 11–307)

## 2018-03-17 LAB — TECHNOLOGIST SMEAR REVIEW

## 2018-03-17 LAB — FOLATE: FOLATE: 18 ng/mL (ref >5.9)

## 2018-03-17 LAB — VITAMIN B12: Vitamin B-12: 323 pg/mL (ref 180–914)

## 2018-03-17 NOTE — Progress Notes (Signed)
Here for new pt evaluation.  

## 2018-03-17 NOTE — Progress Notes (Signed)
Hematology/Oncology Consult note [facility] [phone] [phone]  Patient Care Team: Arnetha Courser, MD as PCP - General (Family Medicine) Anell Barr, [place] (Optometry)   Name of the patient: Desiree Carpenter  [id number]  October 12, 1950    Reason for referral-elliptocytes noted on peripheral smear   Referring physician-Dr. Sanda Klein  Date of visit: 03/17/18   History of presenting illness- Patient is a 68 year old African-American female who has been loose for to [place] for findings of elliptocytes and her peripheral smear.  CBC on 02/27/2018 showed white count of 4.1, H&H of 13.7/42.3 and a platelet count of 211.  Of note her CBC has been normal over the last 3 years.  Pathology smear review showed myeloid population predominantly of mature segmented neutrophils with reactive changes.  No immature cells identified, adequate number of platelets.  RBC s repeat demonstrates slight polychromasia and a few elliptocytes  Overall patient feels well and denies any complaints today.  She has a normal appetite and denies any unintentional weight loss.  She has not had any prior issues with gallstones  ECOG PS- 0  Pain scale- 0   Review of systems- Review of Systems  Constitutional: Negative for chills, fever, malaise/fatigue and weight loss.  HENT: Negative for congestion, ear discharge and nosebleeds.   Eyes: Negative for blurred vision.  Respiratory: Negative for cough, hemoptysis, sputum production, shortness of breath and wheezing.   Cardiovascular: Negative for chest pain, palpitations, orthopnea and claudication.  Gastrointestinal: Negative for abdominal pain, blood in stool, constipation, diarrhea, heartburn, melena, nausea and vomiting.  Genitourinary: Negative for dysuria, flank pain, frequency, hematuria and urgency.  Musculoskeletal: Negative for back pain, joint pain and myalgias.  Skin: Negative for rash.  Neurological: Negative for  dizziness, tingling, focal weakness, seizures, weakness and headaches.  Endo/Heme/Allergies: Does not bruise/bleed easily.  Psychiatric/Behavioral: Negative for depression and suicidal ideas. The patient does not have insomnia.     No Known Allergies  Patient Active Problem List   Diagnosis Date Noted  . Arthritis of both knees 02/27/2018  . Mild hyperlipidemia 02/27/2018  . Need for Streptococcus pneumoniae vaccination 05/07/2017  . Menopausal state 11/29/2016  . Welcome to Medicare preventive visit 11/29/2016  . Postmenopausal status 11/29/2016  . Medication monitoring encounter 11/29/2016  . Full code status 11/29/2016  . Family hx-breast malignancy 03/28/2016  . Preventative health care 03/28/2016  . Obesity 03/28/2016     Past Medical History:  Diagnosis Date  . Arthritis of both knees 02/27/2018  . Mild hyperlipidemia 02/27/2018  . Obesity 03/28/2016     Past Surgical History:  Procedure Laterality Date  . ABDOMINAL HYSTERECTOMY    . BUNIONECTOMY    . COLONOSCOPY WITH PROPOFOL N/A 02/28/2017   Procedure: COLONOSCOPY WITH PROPOFOL;  Surgeon: Jonathon Bellows, MD;  Location: [facility] ENDOSCOPY;  Service: Gastroenterology;  Laterality: N/A;    Social History   Socioeconomic History  . Marital status: Married    Spouse name: Not on file  . Number of children: 1  . Years of education: Not on file  . Highest education level: Bachelor's degree (e.g., BA, AB, BS)  Occupational History  . Occupation: retired  Scientific laboratory technician  . Financial resource strain: Not hard at all  . Food insecurity:    Worry: Never true    Inability: Never true  . Transportation needs:    Medical: No    Non-medical: No  Tobacco Use  . Smoking status: Never Smoker  . Smokeless tobacco: Never Used  Substance and Sexual Activity  . Alcohol use: No    Alcohol/week: 0.0 standard drinks  . Drug use: No  . Sexual activity: Not Currently  Lifestyle  . Physical activity:    Days per week: 3 days    Minutes  per session: 30 min  . Stress: Not at all  Relationships  . Social connections:    Talks on phone: More than three times a week    Gets together: More than three times a week    Attends religious service: More than 4 times per year    Active member of club or organization: No    Attends meetings of clubs or organizations: Never    Relationship status: Married  . Intimate partner violence:    Fear of current or ex partner: No    Emotionally abused: No    Physically abused: No    Forced sexual activity: No  Other Topics Concern  . Not on file  Social History Narrative   Pt has one child who lives in [place] and her mother recently came to live with her     Family History  Problem Relation Age of Onset  . Cancer Mother        breast (47), colon (25)  . Breast cancer Mother 66  . Cancer Father 29       colon  . Cancer Maternal Grandmother        breast  . Breast cancer Maternal Grandmother 37  . Heart disease Maternal Grandfather        heart failure  . Cancer Maternal Grandfather        lung  . Stroke Paternal Grandmother      Current Outpatient Medications:  .  Ascorbic Acid (VITAMIN C PO), Take by mouth daily., Disp: , Rfl:  .  aspirin EC 81 MG tablet, Take 1 tablet (81 mg total) daily by mouth., Disp: , Rfl:  .  atorvastatin (LIPITOR) 10 MG tablet, Take 1 tablet (10 mg total) by mouth at bedtime. (Patient not taking: Reported on 03/17/2018), Disp: 30 tablet, Rfl: 1 .  meloxicam (MOBIC) 15 MG tablet, Take 15 mg by mouth daily. PRN, Disp: , Rfl:  .  Multiple Vitamin (MULTIVITAMIN) tablet, Take 1 tablet by mouth daily., Disp: , Rfl:  .  Pyridoxine HCl (VITAMIN B-6 PO), Take by mouth daily., Disp: , Rfl:    Physical exam:  Vitals:   03/17/18 1305  BP: 118/74  Pulse: 62  Resp: 18  Temp: (!) 96.8 F (36 C)  TempSrc: Tympanic  Weight: 181 lb 6.4 oz (82.3 kg)  Height: 5' 5.35" (1.66 m)   Physical Exam HENT:     Head: Normocephalic and atraumatic.  Eyes:      Pupils: Pupils are equal, round, and reactive to light.  Neck:     Musculoskeletal: Normal range of motion.  Cardiovascular:     Rate and Rhythm: Normal rate and regular rhythm.     Heart sounds: Normal heart sounds.  Pulmonary:     Effort: Pulmonary effort is normal.     Breath sounds: Normal breath sounds.  Abdominal:     General: Bowel sounds are normal. There is no distension.     Palpations: Abdomen is soft.     Tenderness: There is no abdominal tenderness.     Comments: No palpable hepatosplenomegaly  Skin:    General: Skin is warm and dry.  Neurological:     Mental Status: She is alert and oriented to  person, place, and time.        CMP Latest Ref Rng & Units 02/27/2018  Glucose 65 - 99 mg/dL 85  BUN 7 - 25 mg/dL 18  Creatinine 0.50 - 0.99 mg/dL 0.86  Sodium 135 - 146 mmol/L 141  Potassium 3.5 - 5.3 mmol/L 4.8  Chloride 98 - 110 mmol/L 105  CO2 20 - 32 mmol/L 28  Calcium 8.6 - 10.4 mg/dL 10.1  Total Protein 6.1 - 8.1 g/dL 7.0  Total Bilirubin 0.2 - 1.2 mg/dL 0.5  Alkaline Phos 33 - 130 U/L -  AST 10 - 35 U/L 23  ALT 6 - 29 U/L 20   CBC Latest Ref Rng & Units 02/27/2018  WBC 3.8 - 10.8 Thousand/uL 4.1  Hemoglobin 11.7 - 15.5 g/dL 13.7  Hematocrit 35.0 - 45.0 % 42.3  Platelets 140 - 400 Thousand/uL 211    Assessment and plan- Patient is a 68 y.o. female referred for findings of elliptocytes and her peripheral smear  Patient was noted to have few elliptocytes and not majority of her blood smear showed elliptocytes which would be more concerning for hereditary spherocytosis or elliptocytosis.  Patient does not have any palpable splenomegaly or prior history of gallstones  Elliptocytes can be seen sometimes in conditions like iron deficiency and megaloblastic anemia.  She does not have microcytic anemia that would be suggestive of condition such as thalassemia where elliptocytes can be seen as well.   Today I will check a CBC with differential, iron studies and  ferritin B12 and folate.  I will also do a hemolytic work-up including an LDH haptoglobin and reticulocyte count although I am not suspecting any significant hemolysis at this time given that her CMP including total bilirubin was normal and she is not anemic.  I will see her back in 2 weeks time to discuss the results of her blood work.   Thank you for this kind referral and the opportunity to participate in the care of this patient   Visit Diagnosis 1. Abnormal blood smear     Dr. Randa Evens, MD, MPH [facility] [phone] 03/17/2018  1:32 PM

## 2018-03-18 LAB — HAPTOGLOBIN: Haptoglobin: 109 mg/dL (ref 37–355)

## 2018-03-18 NOTE — Discharge Instructions (Signed)
General Anesthesia, Adult, Care After  This sheet gives you information about how to care for yourself after your procedure. Your health care provider may also give you more specific instructions. If you have problems or questions, contact your health care provider.  What can I expect after the procedure?  After the procedure, the following side effects are common:  Pain or discomfort at the IV site.  Nausea.  Vomiting.  Sore throat.  Trouble concentrating.  Feeling cold or chills.  Weak or tired.  Sleepiness and fatigue.  Soreness and body aches. These side effects can affect parts of the body that were not involved in surgery.  Follow these instructions at home:    For at least 24 hours after the procedure:  Have a responsible adult stay with you. It is important to have someone help care for you until you are awake and alert.  Rest as needed.  Do not:  Participate in activities in which you could fall or become injured.  Drive.  Use heavy machinery.  Drink alcohol.  Take sleeping pills or medicines that cause drowsiness.  Make important decisions or sign legal documents.  Take care of children on your own.  Eating and drinking  Follow any instructions from your health care provider about eating or drinking restrictions.  When you feel hungry, start by eating small amounts of foods that are soft and easy to digest (bland), such as toast. Gradually return to your regular diet.  Drink enough fluid to keep your urine pale yellow.  If you vomit, rehydrate by drinking water, juice, or clear broth.  General instructions  If you have sleep apnea, surgery and certain medicines can increase your risk for breathing problems. Follow instructions from your health care provider about wearing your sleep device:  Anytime you are sleeping, including during daytime naps.  While taking prescription pain medicines, sleeping medicines, or medicines that make you drowsy.  Return to your normal activities as told by your health care  provider. Ask your health care provider what activities are safe for you.  Take over-the-counter and prescription medicines only as told by your health care provider.  If you smoke, do not smoke without supervision.  Keep all follow-up visits as told by your health care provider. This is important.  Contact a health care provider if:  You have nausea or vomiting that does not get better with medicine.  You cannot eat or drink without vomiting.  You have pain that does not get better with medicine.  You are unable to pass urine.  You develop a skin rash.  You have a fever.  You have redness around your IV site that gets worse.  Get help right away if:  You have difficulty breathing.  You have chest pain.  You have blood in your urine or stool, or you vomit blood.  Summary  After the procedure, it is common to have a sore throat or nausea. It is also common to feel tired.  Have a responsible adult stay with you for the first 24 hours after general anesthesia. It is important to have someone help care for you until you are awake and alert.  When you feel hungry, start by eating small amounts of foods that are soft and easy to digest (bland), such as toast. Gradually return to your regular diet.  Drink enough fluid to keep your urine pale yellow.  Return to your normal activities as told by your health care provider. Ask your health care   provider what activities are safe for you.  This information is not intended to replace advice given to you by your health care provider. Make sure you discuss any questions you have with your health care provider.  Document Released: 04/16/2000 Document Revised: 08/24/2016 Document Reviewed: 08/24/2016  Elsevier Interactive Patient Education  2019 [employer]vier Inc[employer]

## 2018-03-21 ENCOUNTER — Ambulatory Visit: Payer: Medicare HMO | Admitting: Anesthesiology

## 2018-03-21 ENCOUNTER — Ambulatory Visit
Admission: RE | Admit: 2018-03-21 | Discharge: 2018-03-21 | Disposition: A | Discharge status: Home / Self Care | Payer: Medicare HMO | Attending: Gastroenterology | Admitting: Gastroenterology

## 2018-03-21 ENCOUNTER — Encounter
Admission: RE | Disposition: A | Discharge status: Home / Self Care | Payer: Self-pay | Source: Home / Self Care | Attending: Gastroenterology

## 2018-03-21 DIAGNOSIS — Z800 Family history of malignant neoplasm of digestive organs: Secondary | ICD-10-CM | POA: Insufficient documentation

## 2018-03-21 DIAGNOSIS — Z8249 Family history of ischemic heart disease and other diseases of the circulatory system: Secondary | ICD-10-CM | POA: Insufficient documentation

## 2018-03-21 DIAGNOSIS — Z1211 Encounter for screening for malignant neoplasm of colon: Secondary | ICD-10-CM | POA: Insufficient documentation

## 2018-03-21 DIAGNOSIS — Z6831 Body mass index (BMI) 31.0-31.9, adult: Secondary | ICD-10-CM | POA: Insufficient documentation

## 2018-03-21 DIAGNOSIS — E785 Hyperlipidemia, unspecified: Secondary | ICD-10-CM | POA: Diagnosis not present

## 2018-03-21 DIAGNOSIS — Z791 Long term (current) use of non-steroidal anti-inflammatories (NSAID): Secondary | ICD-10-CM | POA: Insufficient documentation

## 2018-03-21 DIAGNOSIS — Z79899 Other long term (current) drug therapy: Secondary | ICD-10-CM | POA: Insufficient documentation

## 2018-03-21 DIAGNOSIS — E669 Obesity, unspecified: Secondary | ICD-10-CM | POA: Diagnosis not present

## 2018-03-21 DIAGNOSIS — Z7982 Long term (current) use of aspirin: Secondary | ICD-10-CM | POA: Diagnosis not present

## 2018-03-21 HISTORY — PX: COLONOSCOPY WITH PROPOFOL: SHX5780

## 2018-03-21 SURGERY — COLONOSCOPY WITH PROPOFOL
Anesthesia: General | Site: Rectum

## 2018-03-21 MED ORDER — LIDOCAINE HCL (CARDIAC) PF 100 MG/5ML IV SOSY
PREFILLED_SYRINGE | INTRAVENOUS | Status: DC | PRN
  Administered 2018-03-21: 40 mg via INTRAVENOUS

## 2018-03-21 MED ORDER — LACTATED RINGERS IV SOLN
INTRAVENOUS | Status: DC
  Administered 2018-03-21: 08:00:00 via INTRAVENOUS

## 2018-03-21 MED ORDER — SODIUM CHLORIDE 0.9 % IV SOLN: INTRAVENOUS | Status: DC

## 2018-03-21 MED ORDER — PROPOFOL 10 MG/ML IV BOLUS
INTRAVENOUS | Status: DC | PRN
  Administered 2018-03-21: 30 mg via INTRAVENOUS
  Administered 2018-03-21 (×2): 50 mg via INTRAVENOUS
  Administered 2018-03-21: 100 mg via INTRAVENOUS
  Administered 2018-03-21: 50 mg via INTRAVENOUS
  Administered 2018-03-21: 20 mg via INTRAVENOUS
  Administered 2018-03-21: 30 mg via INTRAVENOUS

## 2018-03-21 MED ORDER — STERILE WATER FOR IRRIGATION IR SOLN
Status: DC | PRN
  Administered 2018-03-21: 08:00:00

## 2018-03-21 SURGICAL SUPPLY — 5 items
CANISTER SUCT 1200ML W/VALVE (MISCELLANEOUS) ×2 IMPLANT
GOWN CVR UNV OPN BCK APRN NK (MISCELLANEOUS) ×2 IMPLANT
GOWN ISOL THUMB LOOP REG UNIV (MISCELLANEOUS) ×2
KIT ENDO PROCEDURE OLY (KITS) ×2 IMPLANT
WATER STERILE IRR 250ML POUR (IV SOLUTION) ×2 IMPLANT

## 2018-03-21 NOTE — Anesthesia Procedure Notes (Signed)
Procedure Name: MAC Date/Time: 03/21/2018 8:12 AM Performed by: Janna Arch, CRNA Pre-anesthesia Checklist: Patient identified, Emergency Drugs available, Suction available, Timeout performed and Patient being monitored Patient Re-evaluated:Patient Re-evaluated prior to induction Oxygen Delivery Method: Nasal cannula Placement Confirmation: positive ETCO2

## 2018-03-21 NOTE — Anesthesia Preprocedure Evaluation (Signed)
Anesthesia Evaluation  Patient identified by MRN, date of birth, ID band Patient awake    Reviewed: Allergy & Precautions, NPO status , Patient's Chart, lab work & pertinent test results  Airway Mallampati: II  TM Distance: >3 FB Neck ROM: Full    Dental no notable dental hx.    Pulmonary neg pulmonary ROS,    Pulmonary exam normal breath sounds clear to auscultation       Cardiovascular negative cardio ROS Normal cardiovascular exam Rhythm:Regular Rate:Normal  hyperlipidemia   Neuro/Psych negative neurological ROS  negative psych ROS   GI/Hepatic negative GI ROS, Neg liver ROS,   Endo/Other  negative endocrine ROS  Renal/GU negative Renal ROS  negative genitourinary   Musculoskeletal  (+) Arthritis ,   Abdominal   Peds negative pediatric ROS (+)  Hematology negative hematology ROS (+)   Anesthesia Other Findings   Reproductive/Obstetrics negative OB ROS                             Anesthesia Physical Anesthesia Plan  ASA: II  Anesthesia Plan: General   Post-op Pain Management:    Induction: Intravenous  PONV Risk Score and Plan:   Airway Management Planned: Natural Airway  Additional Equipment:   Intra-op Plan:   Post-operative Plan: Extubation in OR  Informed Consent: I have reviewed the patients History and Physical, chart, labs and discussed the procedure including the risks, benefits and alternatives for the proposed anesthesia with the patient or authorized representative who has indicated his/her understanding and acceptance.     Dental advisory given  Plan Discussed with: CRNA  Anesthesia Plan Comments:         Anesthesia Quick Evaluation

## 2018-03-21 NOTE — Op Note (Signed)
[facility] Gastroenterology Patient Name: Desiree Carpenter Procedure Date: 03/21/2018 8:04 AM MRN: [id number] Account #: [id number] Date of Birth: Jan 12, 1951 Admit Type: Outpatient Age: 68 Room: [facility] OR ROOM 01 Gender: Female Note Status: Finalized Procedure:            Colonoscopy Indications:          Family history of anal canal cancer in a first-degree                        relative Providers:            Lucilla Lame MD, MD Referring MD:         Arnetha Courser (Referring MD) Medicines:            Propofol per Anesthesia Complications:        No immediate complications. Procedure:            Pre-Anesthesia Assessment:                       - Prior to the procedure, a History and Physical was                        performed, and patient medications and allergies were                        reviewed. The patient's tolerance of previous                        anesthesia was also reviewed. The risks and benefits of                        the procedure and the sedation options and risks were                        discussed with the patient. All questions were                        answered, and informed consent was obtained. Prior                        Anticoagulants: The patient has taken no previous                        anticoagulant or antiplatelet agents. ASA Grade                        Assessment: II - A patient with mild systemic disease.                        After reviewing the risks and benefits, the patient was                        deemed in satisfactory condition to undergo the                        procedure.                       After obtaining informed consent, the colonoscope was  passed under direct vision. Throughout the procedure,                        the patient's blood pressure, pulse, and oxygen                        saturations were monitored continuously. The was                        introduced  through the anus and advanced to the the                        cecum, identified by appendiceal orifice and ileocecal                        valve. The colonoscopy was performed without                        difficulty. The patient tolerated the procedure well.                        The quality of the bowel preparation was excellent. Findings:      The perianal and digital rectal examinations were normal.      An area of mildly congested mucosa was found at the ileocecal valve.       This was biopsied with a cold forceps for histology. Impression:           - Congested mucosa at the ileocecal valve. Biopsied. Recommendation:       - Discharge patient to home.                       - Resume previous diet.                       - Continue present medications.                       - Await pathology results.                       - Repeat colonoscopy in 5 years for surveillance. Procedure Code(s):    --- Professional ---                       [phone], Colonoscopy, flexible; with biopsy, single or                        multiple Diagnosis Code(s):    --- Professional ---                       Z80.0, Family history of malignant neoplasm of                        digestive organs CPT copyright 2018 American Medical Association. All rights reserved. The codes documented in this report are preliminary and upon coder review may  be revised to meet current compliance requirements. Lucilla Lame MD, MD 03/21/2018 8:34:17 AM This report has been signed electronically. Number of Addenda: 0 Note Initiated On: 03/21/2018 8:04 AM Scope Withdrawal Time: 0 hours 10 minutes 28 seconds  Total Procedure Duration: 0 hours 15 minutes 46 seconds  [facility]

## 2018-03-21 NOTE — H&P (Signed)
Desiree Lame, MD [address] [place],  [address] Phone:[phone] Fax : [phone]  Primary Care Physician:  Arnetha Courser, MD Primary Gastroenterologist:  Dr. Allen Norris  Pre-Procedure History & Physical: HPI:  Desiree Carpenter is a 68 y.o. female is here for an colonoscopy.   Past Medical History:  Diagnosis Date  . Arthritis of both knees 02/27/2018  . Mild hyperlipidemia 02/27/2018  . Obesity 03/28/2016    Past Surgical History:  Procedure Laterality Date  . ABDOMINAL HYSTERECTOMY    . BUNIONECTOMY    . COLONOSCOPY WITH PROPOFOL N/A 02/28/2017   Procedure: COLONOSCOPY WITH PROPOFOL;  Surgeon: Jonathon Bellows, MD;  Location: [facility] ENDOSCOPY;  Service: Gastroenterology;  Laterality: N/A;    Prior to Admission medications   Medication Sig Start Date End Date Taking? Authorizing Provider  Ascorbic Acid (VITAMIN C PO) Take by mouth daily.   Yes [provider]  aspirin EC 81 MG tablet Take 1 tablet (81 mg total) daily by mouth. 11/29/16  Yes Lada, Satira Anis, MD  atorvastatin (LIPITOR) 10 MG tablet Take 1 tablet (10 mg total) by mouth at bedtime. 02/28/18  Yes Lada, Satira Anis, MD  meloxicam (MOBIC) 15 MG tablet Take 15 mg by mouth daily. PRN   Yes [provider]  Multiple Vitamin (MULTIVITAMIN) tablet Take 1 tablet by mouth daily.   Yes [provider]  Pyridoxine HCl (VITAMIN B-6 PO) Take by mouth daily.   Yes [provider]    Allergies as of 02/18/2018  . (No Known Allergies)    Family History  Problem Relation Age of Onset  . Cancer Mother        breast (76), colon (50)  . Breast cancer Mother 27  . Cancer Father 41       colon  . Cancer Maternal Grandmother        breast  . Breast cancer Maternal Grandmother 45  . Heart disease Maternal Grandfather        heart failure  . Cancer Maternal Grandfather        lung  . Stroke Paternal Grandmother     Social History   Socioeconomic History  . Marital status:  Married    Spouse name: Not on file  . Number of children: 1  . Years of education: Not on file  . Highest education level: Bachelor's degree (e.g., BA, AB, BS)  Occupational History  . Occupation: retired  Scientific laboratory technician  . Financial resource strain: Not hard at all  . Food insecurity:    Worry: Never true    Inability: Never true  . Transportation needs:    Medical: No    Non-medical: No  Tobacco Use  . Smoking status: Never Smoker  . Smokeless tobacco: Never Used  Substance and Sexual Activity  . Alcohol use: No    Alcohol/week: 0.0 standard drinks  . Drug use: No  . Sexual activity: Not Currently  Lifestyle  . Physical activity:    Days per week: 3 days    Minutes per session: 30 min  . Stress: Not at all  Relationships  . Social connections:    Talks on phone: More than three times a week    Gets together: More than three times a week    Attends religious service: More than 4 times per year    Active member of club or organization: No    Attends meetings of clubs or organizations: Never    Relationship status: Married  .  Intimate partner violence:    Fear of current or ex partner: No    Emotionally abused: No    Physically abused: No    Forced sexual activity: No  Other Topics Concern  . Not on file  Social History Narrative   Pt has one child who lives in [place] and her mother recently came to live with her    Review of Systems: See HPI, otherwise negative ROS  Physical Exam: BP 109/81   Pulse 62   Temp 98.1 F (36.7 C) (Temporal)   Resp 17   Ht 5' 3.5" (1.613 m)   Wt 81.2 kg   SpO2 100%   BMI 31.21 kg/m  General:   Alert,  pleasant and cooperative in NAD Head:  Normocephalic and atraumatic. Neck:  Supple; no masses or thyromegaly. Lungs:  Clear throughout to auscultation.    Heart:  Regular rate and rhythm. Abdomen:  Soft, nontender and nondistended. Normal bowel sounds, without guarding, and without rebound.   Neurologic:  Alert and   oriented x4;  grossly normal neurologically.  Impression/Plan: [name] is here for an colonoscopy to be performed for family history of colon cancer  Risks, benefits, limitations, and alternatives regarding  colonoscopy have been reviewed with the patient.  Questions have been answered.  All parties agreeable.   Desiree Lame, MD  03/21/2018, 8:05 AM

## 2018-03-21 NOTE — Anesthesia Postprocedure Evaluation (Signed)
Anesthesia Post Note  Patient: Desiree Carpenter  Procedure(s) Performed: COLONOSCOPY WITH BIOPSY (N/A Rectum)  Patient location during evaluation: PACU Anesthesia Type: General Level of consciousness: awake and alert Pain management: pain level controlled Vital Signs Assessment: post-procedure vital signs reviewed and stable Respiratory status: spontaneous breathing, nonlabored ventilation, respiratory function stable and patient connected to nasal cannula oxygen Cardiovascular status: blood pressure returned to baseline and stable Postop Assessment: no apparent nausea or vomiting Anesthetic complications: no    Aquan Kope C

## 2018-03-21 NOTE — Transfer of Care (Signed)
Immediate Anesthesia Transfer of Care Note  Patient: Desiree Carpenter  Procedure(s) Performed: COLONOSCOPY WITH BIOPSY (N/A Rectum)  Patient Location: PACU  Anesthesia Type: General  Level of Consciousness: awake, alert  and patient cooperative  Airway and Oxygen Therapy: Patient Spontanous Breathing and Patient connected to supplemental oxygen  Post-op Assessment: Post-op Vital signs reviewed, Patient's Cardiovascular Status Stable, Respiratory Function Stable, Patent Airway and No signs of Nausea or vomiting  Post-op Vital Signs: Reviewed and stable  Complications: No apparent anesthesia complications

## 2018-03-22 ENCOUNTER — Other Ambulatory Visit: Payer: Self-pay | Admitting: Family Medicine

## 2018-03-23 NOTE — Telephone Encounter (Signed)
Rx request received for statin Too soon Please resolve with pharmacy; [facility] keeps missing the refill for some reason I sent 30 pills with one refill on 02/28/2018 and she shouldn't need a refill until after we've checked her lipids again in late March or early April

## 2018-03-24 ENCOUNTER — Encounter: Payer: Self-pay | Admitting: Gastroenterology

## 2018-03-24 NOTE — Telephone Encounter (Signed)
Pharmacy notified.

## 2018-03-25 ENCOUNTER — Encounter: Payer: Self-pay | Admitting: Gastroenterology

## 2018-03-27 ENCOUNTER — Other Ambulatory Visit: Payer: Self-pay | Admitting: Family Medicine

## 2018-03-27 NOTE — Telephone Encounter (Signed)
I know this should be refused, because you are not prescriber

## 2018-03-27 NOTE — Telephone Encounter (Signed)
I used to prescribe this for her Lab Results  Component Value Date   CREATININE 0.86 02/27/2018   Lab Results  Component Value Date   WBC 4.7 03/17/2018   HGB 12.8 03/17/2018   HCT 40.6 03/17/2018   MCV 88.3 03/17/2018   PLT 220 03/17/2018

## 2018-03-31 ENCOUNTER — Inpatient Hospital Stay: Payer: Medicare HMO | Attending: Oncology | Admitting: Oncology

## 2018-03-31 ENCOUNTER — Other Ambulatory Visit: Payer: Self-pay

## 2018-03-31 ENCOUNTER — Encounter: Payer: Self-pay | Admitting: Oncology

## 2018-03-31 VITALS — BP 121/77 | HR 60 | Temp 97.4°F | Resp 16 | Ht 63.0 in | Wt 187.4 lb

## 2018-03-31 DIAGNOSIS — D581 Hereditary elliptocytosis: Secondary | ICD-10-CM | POA: Insufficient documentation

## 2018-03-31 DIAGNOSIS — R799 Abnormal finding of blood chemistry, unspecified: Secondary | ICD-10-CM

## 2018-03-31 NOTE — Progress Notes (Signed)
Patient here for follow up. She reports no changes since last appointment. 

## 2018-03-31 NOTE — Progress Notes (Signed)
Hematology/Oncology Consult note [facility]  [phone] [phone]  Patient Care Team: Arnetha Courser, MD as PCP - General (Family Medicine) Anell Barr, [place] (Optometry)   Name of the patient: Desiree Carpenter  [id number]  [birth year]   Date of visit: 03/31/18  Diagnosis- Elliptocytosis  Chief complaint/ Reason for visit-discuss results of blood work and further management  Heme/Onc history:  Patient is a 68 year old African-American female who has been loose for to [place] for findings of elliptocytes and her peripheral smear.  CBC on 02/27/2018 showed white count of 4.1, H&H of 13.7/42.3 and a platelet count of 211.  Of note her CBC has been normal over the last 3 years.  Pathology smear review showed myeloid population predominantly of mature segmented neutrophils with reactive changes.  No immature cells identified, adequate number of platelets.  RBC s repeat demonstrates slight polychromasia and a few elliptocytes  Results of blood work from 03/17/2018 were as follows: CBC showed white count of 4.7, H&H of 12.8/40.6 with an MCV of 88.3 and a platelet count of 220.  Ferritin and iron studies are within normal limits.  B12 and folate was normal.  Haptoglobin was normal.  Reticulocyte count and LDH was normal.  Smear review showed occasional elliptocytes.  Platelets vary in size with large platelets noted.  Interval history-she feels well overall and denies any specific complaints today  ECOG PS- 0 Pain scale- 0   Review of systems- Review of Systems  Constitutional: Negative for chills, fever, malaise/fatigue and weight loss.  HENT: Negative for congestion, ear discharge and nosebleeds.   Eyes: Negative for blurred vision.  Respiratory: Negative for cough, hemoptysis, sputum production, shortness of breath and wheezing.   Cardiovascular: Negative for chest pain, palpitations, orthopnea and claudication.  Gastrointestinal: Negative for  abdominal pain, blood in stool, constipation, diarrhea, heartburn, melena, nausea and vomiting.  Genitourinary: Negative for dysuria, flank pain, frequency, hematuria and urgency.  Musculoskeletal: Negative for back pain, joint pain and myalgias.  Skin: Negative for rash.  Neurological: Negative for dizziness, tingling, focal weakness, seizures, weakness and headaches.  Endo/Heme/Allergies: Does not bruise/bleed easily.  Psychiatric/Behavioral: Negative for depression and suicidal ideas. The patient does not have insomnia.        No Known Allergies   Past Medical History:  Diagnosis Date  . Arthritis of both knees 02/27/2018  . Mild hyperlipidemia 02/27/2018  . Obesity 03/28/2016     Past Surgical History:  Procedure Laterality Date  . ABDOMINAL HYSTERECTOMY    . BUNIONECTOMY    . COLONOSCOPY WITH PROPOFOL N/A 02/28/2017   Procedure: COLONOSCOPY WITH PROPOFOL;  Surgeon: Jonathon Bellows, MD;  Location: [facility] ENDOSCOPY;  Service: Gastroenterology;  Laterality: N/A;  . COLONOSCOPY WITH PROPOFOL N/A 03/21/2018   Procedure: COLONOSCOPY WITH BIOPSY;  Surgeon: Lucilla Lame, MD;  Location: [place];  Service: Endoscopy;  Laterality: N/A;    Social History   Socioeconomic History  . Marital status: Married    Spouse name: Not on file  . Number of children: 1  . Years of education: Not on file  . Highest education level: Bachelor's degree (e.g., BA, AB, BS)  Occupational History  . Occupation: retired  Scientific laboratory technician  . Financial resource strain: Not hard at all  . Food insecurity:    Worry: Never true    Inability: Never true  . Transportation needs:    Medical: No    Non-medical: No  Tobacco Use  . Smoking status: Never Smoker  .  Smokeless tobacco: Never Used  Substance and Sexual Activity  . Alcohol use: No    Alcohol/week: 0.0 standard drinks  . Drug use: No  . Sexual activity: Not Currently  Lifestyle  . Physical activity:    Days per week: 3 days    Minutes per  session: 30 min  . Stress: Not at all  Relationships  . Social connections:    Talks on phone: More than three times a week    Gets together: More than three times a week    Attends religious service: More than 4 times per year    Active member of club or organization: No    Attends meetings of clubs or organizations: Never    Relationship status: Married  . Intimate partner violence:    Fear of current or ex partner: No    Emotionally abused: No    Physically abused: No    Forced sexual activity: No  Other Topics Concern  . Not on file  Social History Narrative   Pt has one child who lives in [place] and her mother recently came to live with her    Family History  Problem Relation Age of Onset  . Cancer Mother        breast (41), colon (31)  . Breast cancer Mother 7  . Cancer Father 30       colon  . Cancer Maternal Grandmother        breast  . Breast cancer Maternal Grandmother 53  . Heart disease Maternal Grandfather        heart failure  . Cancer Maternal Grandfather        lung  . Stroke Paternal Grandmother      Current Outpatient Medications:  .  Ascorbic Acid (VITAMIN C PO), Take by mouth daily., Disp: , Rfl:  .  aspirin EC 81 MG tablet, Take 1 tablet (81 mg total) daily by mouth., Disp: , Rfl:  .  atorvastatin (LIPITOR) 10 MG tablet, Take 1 tablet (10 mg total) by mouth at bedtime., Disp: 30 tablet, Rfl: 1 .  meloxicam (MOBIC) 15 MG tablet, Take 0.5-1 tablets (7.5-15 mg total) by mouth daily as needed for pain. Caution: take at least one hour AFTER aspirin, Disp: 90 tablet, Rfl: 0 .  Multiple Vitamin (MULTIVITAMIN) tablet, Take 1 tablet by mouth daily., Disp: , Rfl:  .  Pyridoxine HCl (VITAMIN B-6 PO), Take by mouth daily., Disp: , Rfl:   Physical exam:  Vitals:   03/31/18 0957 03/31/18 1000  BP:  121/77  Pulse:  60  Resp: 16   Temp:  (!) 97.4 F (36.3 C)  TempSrc:  Tympanic  Weight: 187 lb 6.4 oz (85 kg)   Height: 5\' 3"  (1.6 m)    Physical  Exam Constitutional:      General: She is not in acute distress. HENT:     Head: Normocephalic and atraumatic.  Eyes:     Pupils: Pupils are equal, round, and reactive to light.  Neck:     Musculoskeletal: Normal range of motion.  Cardiovascular:     Rate and Rhythm: Normal rate and regular rhythm.     Heart sounds: Normal heart sounds.  Pulmonary:     Effort: Pulmonary effort is normal.     Breath sounds: Normal breath sounds.  Abdominal:     General: Bowel sounds are normal.     Palpations: Abdomen is soft.  Skin:    General: Skin is warm and dry.  Neurological:  Mental Status: She is alert and oriented to person, place, and time.      CMP Latest Ref Rng & Units 02/27/2018  Glucose 65 - 99 mg/dL 85  BUN 7 - 25 mg/dL 18  Creatinine 0.50 - 0.99 mg/dL 0.86  Sodium 135 - 146 mmol/L 141  Potassium 3.5 - 5.3 mmol/L 4.8  Chloride 98 - 110 mmol/L 105  CO2 20 - 32 mmol/L 28  Calcium 8.6 - 10.4 mg/dL 10.1  Total Protein 6.1 - 8.1 g/dL 7.0  Total Bilirubin 0.2 - 1.2 mg/dL 0.5  Alkaline Phos 33 - 130 U/L -  AST 10 - 35 U/L 23  ALT 6 - 29 U/L 20   CBC Latest Ref Rng & Units 03/17/2018  WBC 4.0 - 10.5 K/uL 4.7  Hemoglobin 12.0 - 15.0 g/dL 12.8  Hematocrit 36.0 - 46.0 % 40.6  Platelets 150 - 400 K/uL 220      Assessment and plan- Patient is a 68 y.o. female with following issues:  1.  Elliptocytes noted on peripheral smear.  Patient has no anemia and has normal iron studies B12 folate.  No evidence of hemolysis.  Occasional elliptocytes noted on peripheral smear.  This is not consistent with healthy frail elliptocytosis.  At this time I am inclined to monitor this conservatively and see her back in 6 months time with a repeat CBC with differential and a technology smear review.  2.  Although there was some variation noted in the size of her platelets with large platelets, in the absence of any significant thrombocytosis or thrombocytopenia this would not be significant  clinically and can be continued to monitor   Visit Diagnosis 1. Abnormal blood smear      Dr. Randa Evens, MD, MPH [facility] [phone] 03/31/2018 1:10 PM

## 2018-05-07 ENCOUNTER — Other Ambulatory Visit: Payer: Self-pay | Admitting: Family Medicine

## 2018-05-08 NOTE — Telephone Encounter (Signed)
Please call patient Let patient know that he/she is due for labs HOWEVER, during this outbreak of COVID-19, we understand if he/she wants to wait and have labs done in a few more months when the pandemic died down The orders are in there I've sent refills Patient can come in when he/she is comfortable

## 2018-05-09 MED ORDER — PRAVASTATIN SODIUM 20 MG PO TABS: 20.0000 mg | ORAL_TABLET | Freq: Every day | ORAL | 0 refills | Status: DC

## 2018-05-09 NOTE — Telephone Encounter (Signed)
Please add constipation as a side effect (under allergy tab please) I've sent in another medicine and we'll see what her labs look like in 6-8 weeks; thank you

## 2018-05-09 NOTE — Telephone Encounter (Signed)
[facility] sent the refill. Called patient and she has not taken the medication since March, she said it was causing constipation.

## 2018-05-09 NOTE — Telephone Encounter (Signed)
Pt said she is willing to try a new medication for her cholesterol.

## 2018-05-09 NOTE — Telephone Encounter (Signed)
I have removed the atorvastatin from the med list Please add constipation as an side effect for this to update her chart Call patient and ask if she's willing to try another medicine for her cholesterol If so, ask her to have labs done 6-8 weeks after starting Thank you

## 2018-05-09 NOTE — Telephone Encounter (Signed)
Pt notified    Allergy added

## 2018-06-17 DIAGNOSIS — Z7722 Contact with and (suspected) exposure to environmental tobacco smoke (acute) (chronic): Secondary | ICD-10-CM | POA: Diagnosis not present

## 2018-06-17 DIAGNOSIS — E785 Hyperlipidemia, unspecified: Secondary | ICD-10-CM | POA: Diagnosis not present

## 2018-06-17 DIAGNOSIS — Z809 Family history of malignant neoplasm, unspecified: Secondary | ICD-10-CM | POA: Diagnosis not present

## 2018-06-17 DIAGNOSIS — Z803 Family history of malignant neoplasm of breast: Secondary | ICD-10-CM | POA: Diagnosis not present

## 2018-06-17 DIAGNOSIS — Z8249 Family history of ischemic heart disease and other diseases of the circulatory system: Secondary | ICD-10-CM | POA: Diagnosis not present

## 2018-06-17 DIAGNOSIS — Z833 Family history of diabetes mellitus: Secondary | ICD-10-CM | POA: Diagnosis not present

## 2018-06-17 DIAGNOSIS — Z823 Family history of stroke: Secondary | ICD-10-CM | POA: Diagnosis not present

## 2018-08-21 DIAGNOSIS — R69 Illness, unspecified: Secondary | ICD-10-CM | POA: Diagnosis not present

## 2018-08-28 ENCOUNTER — Other Ambulatory Visit: Payer: Self-pay | Admitting: Family Medicine

## 2018-09-01 ENCOUNTER — Ambulatory Visit (INDEPENDENT_AMBULATORY_CARE_PROVIDER_SITE_OTHER): Payer: Medicare HMO | Admitting: Nurse Practitioner

## 2018-09-01 ENCOUNTER — Encounter: Payer: Self-pay | Admitting: Nurse Practitioner

## 2018-09-01 ENCOUNTER — Other Ambulatory Visit: Payer: Self-pay

## 2018-09-01 VITALS — BP 112/62 | HR 70 | Temp 96.9°F | Resp 14 | Ht 63.0 in | Wt 188.2 lb

## 2018-09-01 DIAGNOSIS — Z6832 Body mass index (BMI) 32.0-32.9, adult: Secondary | ICD-10-CM

## 2018-09-01 DIAGNOSIS — Z803 Family history of malignant neoplasm of breast: Secondary | ICD-10-CM

## 2018-09-01 DIAGNOSIS — M170 Bilateral primary osteoarthritis of knee: Secondary | ICD-10-CM | POA: Diagnosis not present

## 2018-09-01 DIAGNOSIS — Z1239 Encounter for other screening for malignant neoplasm of breast: Secondary | ICD-10-CM

## 2018-09-01 DIAGNOSIS — E785 Hyperlipidemia, unspecified: Secondary | ICD-10-CM

## 2018-09-01 DIAGNOSIS — E6609 Other obesity due to excess calories: Secondary | ICD-10-CM | POA: Diagnosis not present

## 2018-09-01 DIAGNOSIS — Z23 Encounter for immunization: Secondary | ICD-10-CM | POA: Diagnosis not present

## 2018-09-01 NOTE — Patient Instructions (Addendum)
Please do call to schedule your mammogram & DEXA scan; the number to schedule one at either [facility] or [facility] is [phone]  Bad cholesterol, also called low-density lipoprotein (LDL), carries cholesterol and other fats that your liver makes to your body tissue. If it builds up in blood vessels, LDL can cause heart disease and other health problems. Your LDL level should be below 100. If you have diabetes or a possible heart problem, your LDL should be below 70.  Eat: Eat 20 to 30 grams of soluble fiber every day. Foods such as fruits and vegetables, whole grains, beans, peas, nuts, and seeds can help lower LDL. Avoid: Saturated fats (Dairy foods - such as butter, cream, ghee, regular-fat milk and cheese. Meat - such as fatty cuts of beef, pork and lamb, processed meats like salami, sausages and the skin on chicken. Lard., fatty snack foods, cakes, biscuits, pies and deep fried foods) Avoid smoking

## 2018-09-01 NOTE — Progress Notes (Signed)
Name: Desiree Carpenter   MRN: [id number]    DOB: 1950/06/30   Date:09/01/2018       Progress Note  Subjective  Chief Complaint  Chief Complaint  Patient presents with  . Follow-up  . Hyperlipidemia  . Medication Refill    HPI  Hyperlipidemia Patient rx pravastatin 20mg  nightly.  Diet: eats vegetables daily, has fried foods 1-2 times aweek.  Denies myalgias Lab Results  Component Value Date   CHOL 207 (H) 02/27/2018   HDL 65 02/27/2018   LDLCALC 126 (H) 02/27/2018   TRIG 64 02/27/2018   CHOLHDL 3.2 02/27/2018    Obesity Diet: drinks lots of water, occasionally cranberry juice. Has been snacking a lot during pandemic- states not healthy snacks.  Exercise: was walking daily but it has gotten too hot Wt Readings from Last 3 Encounters:  09/01/18 188 lb 3.2 oz (85.4 kg)  03/31/18 187 lb 6.4 oz (85 kg)  03/21/18 179 lb (81.2 kg)   Bilateral osteoarthritis of knees Takes meloxicam 7.5-15mg  daily PRN for severe pain- states does not use this anymore. Pain is manageable without meds.   Family history of breast cancer Mother and maternal grandmother. She does self-breast exams, has not noticed any unusual lumps or breast pain or nipple discharge.   PHQ2/9: Depression screen [facility] 2/9 09/01/2018 02/27/2018 02/14/2018 08/13/2017 11/29/2016  Decreased Interest 0 0 0 0 0  Down, Depressed, Hopeless 0 0 0 0 0  PHQ - 2 Score 0 0 0 0 0  Altered sleeping 0 0 - - -  Tired, decreased energy 0 0 - - -  Change in appetite 0 0 - - -  Feeling bad or failure about yourself  0 0 - - -  Trouble concentrating 0 0 - - -  Moving slowly or fidgety/restless 0 0 - - -  Suicidal thoughts 0 0 - - -  PHQ-9 Score 0 0 - - -  Difficult doing work/chores Not difficult at all Not difficult at all - - -    PHQ reviewed. Negative  Patient Active Problem List   Diagnosis Date Noted  . Arthritis of both knees 02/27/2018  . Mild hyperlipidemia 02/27/2018  . Family hx-breast malignancy 03/28/2016  .  Obesity 03/28/2016    Past Medical History:  Diagnosis Date  . Arthritis of both knees 02/27/2018  . Mild hyperlipidemia 02/27/2018  . Obesity 03/28/2016    Past Surgical History:  Procedure Laterality Date  . ABDOMINAL HYSTERECTOMY    . BUNIONECTOMY    . COLONOSCOPY WITH PROPOFOL N/A 02/28/2017   Procedure: COLONOSCOPY WITH PROPOFOL;  Surgeon: Jonathon Bellows, MD;  Location: [facility] ENDOSCOPY;  Service: Gastroenterology;  Laterality: N/A;  . COLONOSCOPY WITH PROPOFOL N/A 03/21/2018   Procedure: COLONOSCOPY WITH BIOPSY;  Surgeon: Lucilla Lame, MD;  Location: [place];  Service: Endoscopy;  Laterality: N/A;    Social History   Tobacco Use  . Smoking status: Never Smoker  . Smokeless tobacco: Never Used  Substance Use Topics  . Alcohol use: No    Alcohol/week: 0.0 standard drinks     Current Outpatient Medications:  .  Ascorbic Acid (VITAMIN C PO), Take by mouth daily., Disp: , Rfl:  .  aspirin EC 81 MG tablet, Take 1 tablet (81 mg total) daily by mouth., Disp: , Rfl:  .  Multiple Vitamin (MULTIVITAMIN) tablet, Take 1 tablet by mouth daily., Disp: , Rfl:  .  pravastatin (PRAVACHOL) 20 MG tablet, TAKE 1 TABLET BY MOUTH EVERY DAY, Disp: 90 tablet,  Rfl: 0 .  meloxicam (MOBIC) 15 MG tablet, Take 0.5-1 tablets (7.5-15 mg total) by mouth daily as needed for pain. Caution: take at least one hour AFTER aspirin (Patient not taking: Reported on 09/01/2018), Disp: 90 tablet, Rfl: 0 .  Pyridoxine HCl (VITAMIN B-6 PO), Take by mouth daily., Disp: , Rfl:   Allergies  Allergen Reactions  . Atorvastatin     Constipation    Review of Systems  Constitutional: Negative for chills, fever and malaise/fatigue.  Respiratory: Negative for cough and shortness of breath.   Cardiovascular: Negative for chest pain, palpitations and leg swelling.  Gastrointestinal: Negative for abdominal pain.  Musculoskeletal: Negative for joint pain and myalgias.  Skin: Negative for rash.  Neurological: Negative for  dizziness and headaches.  Psychiatric/Behavioral: The patient is not nervous/anxious and does not have insomnia.       No other specific complaints in a complete review of systems (except as listed in HPI above).  Objective  Vitals:   09/01/18 0957  BP: 112/62  Pulse: 70  Resp: 14  Temp: (!) 96.9 F (36.1 C)  SpO2: 94%  Weight: 188 lb 3.2 oz (85.4 kg)  Height: 5\' 3"  (1.6 m)    Body mass index is 33.34 kg/m.  Nursing Note and Vital Signs reviewed.  Physical Exam Vitals signs reviewed.  Constitutional:      Appearance: She is well-developed.  HENT:     Head: Normocephalic and atraumatic.  Neck:     Musculoskeletal: Normal range of motion and neck supple.     Vascular: No carotid bruit.  Cardiovascular:     Heart sounds: Normal heart sounds.  Pulmonary:     Effort: Pulmonary effort is normal.     Breath sounds: Normal breath sounds.  Abdominal:     General: Bowel sounds are normal.     Palpations: Abdomen is soft.     Tenderness: There is no abdominal tenderness.  Musculoskeletal: Normal range of motion.  Skin:    General: Skin is warm and dry.     Capillary Refill: Capillary refill takes less than 2 seconds.  Neurological:     Mental Status: She is alert and oriented to person, place, and time.     GCS: GCS eye subscore is 4. GCS verbal subscore is 5. GCS motor subscore is 6.     Sensory: No sensory deficit.  Psychiatric:        Speech: Speech normal.        Behavior: Behavior normal.        Thought Content: Thought content normal.        Judgment: Judgment normal.       No results found for this or any previous visit (from the past 48 hour(s)).  Assessment & Plan  1. Mild hyperlipidemia Discussed diet.  - Lipid Profile  2. Family hx-breast malignancy - MM Digital Screening; Future  3. Class 1 obesity due to excess calories without serious comorbidity with body mass index (BMI) of 32.0 to 32.9 in adult Diet discussed  4. Arthritis of both  knees otc meds PRN   5. Screening for breast cancer - MM Digital Screening; Future  6. Need for Streptococcus pneumoniae vaccination - Pneumococcal polysaccharide vaccine 23-valent greater than or equal to 2yo subcutaneous/IM   -Red flags and when to present for emergency care or RTC including fever >101.69F, chest pain, shortness of breath, new/worsening/un-resolving symptoms,  reviewed with patient at time of visit. Follow up and care instructions discussed and provided in  AVS. -Reviewed Health Maintenance: DEXA, Mammogram ordered, pneumonia vaccine today

## 2018-09-22 ENCOUNTER — Ambulatory Visit
Admission: RE | Admit: 2018-09-22 | Discharge: 2018-09-22 | Disposition: A | Discharge status: Home / Self Care | Payer: Medicare HMO | Source: Ambulatory Visit | Attending: Nurse Practitioner | Admitting: Nurse Practitioner

## 2018-09-22 ENCOUNTER — Other Ambulatory Visit: Payer: Self-pay

## 2018-09-22 DIAGNOSIS — Z803 Family history of malignant neoplasm of breast: Secondary | ICD-10-CM | POA: Insufficient documentation

## 2018-09-22 DIAGNOSIS — Z1231 Encounter for screening mammogram for malignant neoplasm of breast: Secondary | ICD-10-CM | POA: Insufficient documentation

## 2018-09-22 DIAGNOSIS — Z1239 Encounter for other screening for malignant neoplasm of breast: Secondary | ICD-10-CM

## 2018-10-02 ENCOUNTER — Encounter: Payer: Self-pay | Admitting: Oncology

## 2018-10-02 ENCOUNTER — Other Ambulatory Visit: Payer: Self-pay

## 2018-10-02 NOTE — Progress Notes (Signed)
Patient stated that she was doing well with no complaints. 

## 2018-10-02 NOTE — Progress Notes (Signed)
Called patient no answer left message  

## 2018-10-03 ENCOUNTER — Other Ambulatory Visit: Payer: Self-pay

## 2018-10-03 ENCOUNTER — Inpatient Hospital Stay: Payer: Medicare HMO

## 2018-10-03 ENCOUNTER — Inpatient Hospital Stay: Payer: Medicare HMO | Attending: Oncology | Admitting: Oncology

## 2018-10-03 VITALS — BP 121/88 | HR 72 | Temp 98.8°F | Resp 16 | Wt 190.0 lb

## 2018-10-03 DIAGNOSIS — R799 Abnormal finding of blood chemistry, unspecified: Secondary | ICD-10-CM | POA: Insufficient documentation

## 2018-10-03 DIAGNOSIS — E785 Hyperlipidemia, unspecified: Secondary | ICD-10-CM | POA: Diagnosis not present

## 2018-10-03 DIAGNOSIS — Z791 Long term (current) use of non-steroidal anti-inflammatories (NSAID): Secondary | ICD-10-CM | POA: Diagnosis not present

## 2018-10-03 DIAGNOSIS — Z79899 Other long term (current) drug therapy: Secondary | ICD-10-CM | POA: Diagnosis not present

## 2018-10-03 DIAGNOSIS — Z801 Family history of malignant neoplasm of trachea, bronchus and lung: Secondary | ICD-10-CM | POA: Insufficient documentation

## 2018-10-03 DIAGNOSIS — E669 Obesity, unspecified: Secondary | ICD-10-CM | POA: Insufficient documentation

## 2018-10-03 DIAGNOSIS — Z803 Family history of malignant neoplasm of breast: Secondary | ICD-10-CM | POA: Insufficient documentation

## 2018-10-03 DIAGNOSIS — Z7982 Long term (current) use of aspirin: Secondary | ICD-10-CM | POA: Diagnosis not present

## 2018-10-03 LAB — TECHNOLOGIST SMEAR REVIEW: Plt Morphology: ADEQUATE

## 2018-10-03 LAB — CBC WITH DIFFERENTIAL/PLATELET
Abs Immature Granulocytes: 0.02 10*3/uL (ref 0.00–0.07)
Basophils Absolute: 0 10*3/uL (ref 0.0–0.1)
Basophils Relative: 0 %
Eosinophils Absolute: 0.1 10*3/uL (ref 0.0–0.5)
Eosinophils Relative: 2 %
HCT: 40.7 % (ref 36.0–46.0)
Hemoglobin: 12.8 g/dL (ref 12.0–15.0)
Immature Granulocytes: 0 %
Lymphocytes Relative: 34 %
Lymphs Abs: 1.6 10*3/uL (ref 0.7–4.0)
MCH: 27.9 pg (ref 26.0–34.0)
MCHC: 31.4 g/dL (ref 30.0–36.0)
MCV: 88.7 fL (ref 80.0–100.0)
Monocytes Absolute: 0.3 10*3/uL (ref 0.1–1.0)
Monocytes Relative: 6 %
Neutro Abs: 2.7 10*3/uL (ref 1.7–7.7)
Neutrophils Relative %: 58 %
Platelets: 186 10*3/uL (ref 150–400)
RBC: 4.59 MIL/uL (ref 3.87–5.11)
RDW: 12.7 % (ref 11.5–15.5)
WBC: 4.7 10*3/uL (ref 4.0–10.5)
nRBC: 0 % (ref 0.0–0.2)

## 2018-10-06 NOTE — Progress Notes (Signed)
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 CBC on 02/27/2018 showed white count of 4.1, H&H of 13.7/42.3 and a platelet count of 211. Of note her CBC has been normal over the last 3 years. Pathology smear review showed myeloid population predominantly of mature segmented neutrophils with reactive changes. No immature cells identified,adequate number of platelets.RBCs repeat demonstrates slight polychromasia and a few elliptocytes  Results of blood work from 03/17/2018 were as follows: CBC showed white count of 4.7, H&H of 12.8/40.6 with an MCV of 88.3 and a platelet count of 220.  Ferritin and iron studies are within normal limits.  B12 and folate was normal.  Haptoglobin was normal.  Reticulocyte count and LDH was normal.  Smear review showed occasional elliptocytes.  Platelets vary in size with large platelets noted.   Interval history-overall patient feels well and denies any complaints at this time.  Her appetite has been good and weight has been stable.  ECOG PS- 0 Pain scale- 0   Review of systems- Review of Systems  Constitutional: Negative for chills, fever, malaise/fatigue and weight loss.  HENT: Negative for congestion, ear discharge and nosebleeds.   Eyes: Negative for blurred vision.  Respiratory: Negative for cough, hemoptysis, sputum production, shortness of breath and wheezing.    Cardiovascular: Negative for chest pain, palpitations, orthopnea and claudication.  Gastrointestinal: Negative for abdominal pain, blood in stool, constipation, diarrhea, heartburn, melena, nausea and vomiting.  Genitourinary: Negative for dysuria, flank pain, frequency, hematuria and urgency.  Musculoskeletal: Negative for back pain, joint pain and myalgias.  Skin: Negative for rash.  Neurological: Negative for dizziness, tingling, focal weakness, seizures, weakness and headaches.  Endo/Heme/Allergies: Does not bruise/bleed easily.  Psychiatric/Behavioral: Negative for depression and suicidal ideas. The patient does not have insomnia.      Allergies  Allergen Reactions  . Atorvastatin     Constipation     Past Medical History:  Diagnosis Date  . Arthritis of both knees 02/27/2018  . Mild hyperlipidemia 02/27/2018  . Obesity 03/28/2016     Past Surgical History:  Procedure Laterality Date  . ABDOMINAL HYSTERECTOMY    . BUNIONECTOMY    . COLONOSCOPY WITH PROPOFOL N/A 02/28/2017   Procedure: COLONOSCOPY WITH PROPOFOL;  Surgeon: Jonathon Bellows, MD;  Location: [facility] ENDOSCOPY;  Service: Gastroenterology;  Laterality: N/A;  . COLONOSCOPY WITH PROPOFOL N/A 03/21/2018   Procedure: COLONOSCOPY WITH BIOPSY;  Surgeon: Lucilla Lame, MD;  Location: [facility];  Service: Endoscopy;  Laterality: N/A;    Social History   Socioeconomic History  . Marital status: Married    Spouse name: Not on file  . Number of children: 1  . Years of education: Not on file  . Highest education level: Bachelor's degree (e.g., BA, AB, BS)  Occupational History  . Occupation: retired  Scientific laboratory technician  . Financial resource strain: Not hard at all  . Food insecurity    Worry: Never true    Inability: Never true  . Transportation needs  Medical: No    Non-medical: No  Tobacco Use  . Smoking status: Never Smoker  . Smokeless tobacco: Never Used  Substance and Sexual Activity  . Alcohol use: No     Alcohol/week: 0.0 standard drinks  . Drug use: No  . Sexual activity: Not Currently  Lifestyle  . Physical activity    Days per week: 3 days    Minutes per session: 30 min  . Stress: Not at all  Relationships  . Social connections    Talks on phone: More than three times a week    Gets together: More than three times a week    Attends religious service: More than 4 times per year    Active member of club or organization: No    Attends meetings of clubs or organizations: Never    Relationship status: Married  . Intimate partner violence    Fear of current or ex partner: No    Emotionally abused: No    Physically abused: No    Forced sexual activity: No  Other Topics Concern  . Not on file  Social History Narrative   Pt has one child who lives in [place] and her mother recently came to live with her    Family History  Problem Relation Age of Onset  . Cancer Mother        breast (24), colon (57)  . Breast cancer Mother 67  . Cancer Father 33       colon  . Cancer Maternal Grandmother        breast  . Breast cancer Maternal Grandmother 55  . Heart disease Maternal Grandfather        heart failure  . Cancer Maternal Grandfather        lung  . Stroke Paternal Grandmother      Current Outpatient Medications:  .  Ascorbic Acid (VITAMIN C PO), Take by mouth daily., Disp: , Rfl:  .  aspirin EC 81 MG tablet, Take 1 tablet (81 mg total) daily by mouth., Disp: , Rfl:  .  meloxicam (MOBIC) 15 MG tablet, Take 0.5-1 tablets (7.5-15 mg total) by mouth daily as needed for pain. Caution: take at least one hour AFTER aspirin, Disp: 90 tablet, Rfl: 0 .  Multiple Vitamin (MULTIVITAMIN) tablet, Take 1 tablet by mouth daily., Disp: , Rfl:  .  pravastatin (PRAVACHOL) 20 MG tablet, TAKE 1 TABLET BY MOUTH EVERY DAY, Disp: 90 tablet, Rfl: 0 .  Pyridoxine HCl (VITAMIN B-6 PO), Take by mouth daily., Disp: , Rfl:   Physical exam:  Vitals:   10/03/18 1029  BP: 121/88  Pulse: 72  Resp: 16   Temp: 98.8 F (37.1 C)  TempSrc: Tympanic  Weight: 190 lb (86.2 kg)   Physical Exam Constitutional:      General: She is not in acute distress. HENT:     Head: Normocephalic and atraumatic.  Eyes:     Pupils: Pupils are equal, round, and reactive to light.  Neck:     Musculoskeletal: Normal range of motion.  Cardiovascular:     Rate and Rhythm: Normal rate and regular rhythm.     Heart sounds: Normal heart sounds.  Pulmonary:     Effort: Pulmonary effort is normal.     Breath sounds: Normal breath sounds.  Abdominal:     General: Bowel sounds are normal.     Palpations: Abdomen is soft.  Skin:    General: Skin is warm and dry.  Neurological:  Mental Status: She is alert and oriented to person, place, and time.      CMP Latest Ref Rng & Units 02/27/2018  Glucose 65 - 99 mg/dL 85  BUN 7 - 25 mg/dL 18  Creatinine 0.50 - 0.99 mg/dL 0.86  Sodium 135 - 146 mmol/L 141  Potassium 3.5 - 5.3 mmol/L 4.8  Chloride 98 - 110 mmol/L 105  CO2 20 - 32 mmol/L 28  Calcium 8.6 - 10.4 mg/dL 10.1  Total Protein 6.1 - 8.1 g/dL 7.0  Total Bilirubin 0.2 - 1.2 mg/dL 0.5  Alkaline Phos 33 - 130 U/L -  AST 10 - 35 U/L 23  ALT 6 - 29 U/L 20   CBC Latest Ref Rng & Units 10/03/2018  WBC 4.0 - 10.5 K/uL 4.7  Hemoglobin 12.0 - 15.0 g/dL 12.8  Hematocrit 36.0 - 46.0 % 40.7  Platelets 150 - 400 K/uL 186    No images are attached to the encounter.  Mm 3d Screen Breast Bilateral  Result Date: 09/22/2018 CLINICAL DATA:  Screening. EXAM: DIGITAL SCREENING BILATERAL MAMMOGRAM WITH TOMO AND CAD COMPARISON:  Previous exam(s). ACR Breast Density Category b: There are scattered areas of fibroglandular density. FINDINGS: There are no findings suspicious for malignancy. Images were processed with CAD. IMPRESSION: No mammographic evidence of malignancy. A result letter of this screening mammogram will be mailed directly to the patient. RECOMMENDATION: Screening mammogram in one year. (Code:SM-B-01Y)  BI-RADS CATEGORY  1: Negative. Electronically Signed   By: Fidela Salisbury M.D.   On: 09/22/2018 15:19     Assessment and plan- Patient is a 67 y.o. female with occasional elliptocytes noted on peripheral smear  Over the last 6 months patient has had a normal white count hemoglobin as well as platelets.  Smear review showed occasional elliptocytes which at this point are not clinically significant and not causing any cytopenias.  There is some variation in the size of her platelets but her platelet count itself is normal as well.  Patient does not require any follow-up for these abnormal findings and will continue to follow-up with her primary care provider.  She can be referred to [place] if she develops any new cytopenias or B symptoms   Visit Diagnosis 1. Abnormal blood smear      Dr. Randa Evens, MD, MPH [facility] [id number] 10/06/2018 8:30 AM

## 2018-10-14 DIAGNOSIS — E785 Hyperlipidemia, unspecified: Secondary | ICD-10-CM | POA: Diagnosis not present

## 2018-10-14 LAB — LIPID PANEL
Cholesterol: 151 mg/dL (ref <200)
HDL: 62 mg/dL (ref >=50)
LDL Cholesterol (Calc): 74 mg/dL (calc)
Non-HDL Cholesterol (Calc): 89 mg/dL (calc) (ref <130)
Total CHOL/HDL Ratio: 2.4 (calc) (ref <5.0)
Triglycerides: 72 mg/dL (ref <150)

## 2018-10-27 DIAGNOSIS — R69 Illness, unspecified: Secondary | ICD-10-CM | POA: Diagnosis not present

## 2018-11-24 ENCOUNTER — Other Ambulatory Visit: Payer: Self-pay

## 2018-11-25 MED ORDER — PRAVASTATIN SODIUM 20 MG PO TABS: 20.0000 mg | ORAL_TABLET | Freq: Every day | ORAL | 0 refills | Status: DC

## 2018-12-10 DIAGNOSIS — H5203 Hypermetropia, bilateral: Secondary | ICD-10-CM | POA: Diagnosis not present

## 2019-02-19 ENCOUNTER — Other Ambulatory Visit: Payer: Self-pay

## 2019-02-19 ENCOUNTER — Ambulatory Visit (INDEPENDENT_AMBULATORY_CARE_PROVIDER_SITE_OTHER): Payer: Medicare HMO

## 2019-02-19 ENCOUNTER — Ambulatory Visit: Payer: Medicare HMO

## 2019-02-19 VITALS — BP 129/81 | HR 57 | Ht 63.0 in | Wt 185.0 lb

## 2019-02-19 DIAGNOSIS — Z780 Asymptomatic menopausal state: Secondary | ICD-10-CM

## 2019-02-19 DIAGNOSIS — Z0000 Encounter for general adult medical examination without abnormal findings: Secondary | ICD-10-CM

## 2019-02-19 NOTE — Patient Instructions (Signed)
Desiree Carpenter , Thank you for taking time to come for your Medicare Wellness Visit. I appreciate your ongoing commitment to your health goals. Please review the following plan we discussed and let me know if I can assist you in the future.   Screening recommendations/referrals: Colonoscopy: done 03/21/18 Mammogram: done 09/22/18 Bone Density: done 01/31/12. Please call [phone] to schedule your bone density screening.  Recommended yearly ophthalmology/optometry visit for glaucoma screening and checkup Recommended yearly dental visit for hygiene and checkup  Vaccinations: Influenza vaccine: done 10/27/18 Pneumococcal vaccine: done 09/01/18 Tdap vaccine: done 10/05/10 Shingles vaccine: Shingrix discussed. Please contact your pharmacy for coverage information.   Advanced directives: Advance directive discussed with you today. Even though you declined this today please call our office should you change your mind and we can give you the proper paperwork for you to fill out.  Conditions/risks identified: Recommend healthy eating and physical activity for desired weight loss.   Next appointment: Please follow up in one year for your Medicare Annual Wellness visit.     Preventive Care 68 Years and Older, Female Preventive care refers to lifestyle choices and visits with your health care provider that can promote health and wellness. What does preventive care include?  A yearly physical exam. This is also called an annual well check.  Dental exams once or twice a year.  Routine eye exams. Ask your health care provider how often you should have your eyes checked.  Personal lifestyle choices, including:  Daily care of your teeth and gums.  Regular physical activity.  Eating a healthy diet.  Avoiding tobacco and drug use.  Limiting alcohol use.  Practicing safe sex.  Taking low-dose aspirin every day.  Taking vitamin and mineral supplements as recommended by your health care  provider. What happens during an annual well check? The services and screenings done by your health care provider during your annual well check will depend on your age, overall health, lifestyle risk factors, and family history of disease. Counseling  Your health care provider may ask you questions about your:  Alcohol use.  Tobacco use.  Drug use.  Emotional well-being.  Home and relationship well-being.  Sexual activity.  Eating habits.  History of falls.  Memory and ability to understand (cognition).  Work and work Statistician.  Reproductive health. Screening  You may have the following tests or measurements:  Height, weight, and BMI.  Blood pressure.  Lipid and cholesterol levels. These may be checked every 5 years, or more frequently if you are over 83 years old.  Skin check.  Lung cancer screening. You may have this screening every year starting at age 55 if you have a 30-pack-year history of smoking and currently smoke or have quit within the past 15 years.  Fecal occult blood test (FOBT) of the stool. You may have this test every year starting at age 29.  Flexible sigmoidoscopy or colonoscopy. You may have a sigmoidoscopy every 5 years or a colonoscopy every 10 years starting at age 48.  Hepatitis C blood test.  Hepatitis B blood test.  Sexually transmitted disease (STD) testing.  Diabetes screening. This is done by checking your blood sugar (glucose) after you have not eaten for a while (fasting). You may have this done every 1-3 years.  Bone density scan. This is done to screen for osteoporosis. You may have this done starting at age 70.  Mammogram. This may be done every 1-2 years. Talk to your health care provider about how often you  should have regular mammograms. Talk with your health care provider about your test results, treatment options, and if necessary, the need for more tests. Vaccines  Your health care provider may recommend certain  vaccines, such as:  Influenza vaccine. This is recommended every year.  Tetanus, diphtheria, and acellular pertussis (Tdap, Td) vaccine. You may need a Td booster every 10 years.  Zoster vaccine. You may need this after age 27.  Pneumococcal 13-valent conjugate (PCV13) vaccine. One dose is recommended after age 81.  Pneumococcal polysaccharide (PPSV23) vaccine. One dose is recommended after age 14. Talk to your health care provider about which screenings and vaccines you need and how often you need them. This information is not intended to replace advice given to you by your health care provider. Make sure you discuss any questions you have with your health care provider. Document Released: 02/04/2015 Document Revised: 09/28/2015 Document Reviewed: 11/09/2014 Elsevier Interactive Patient Education  2017 [employer] Prevention in the Home Falls can cause injuries. They can happen to people of all ages. There are many things you can do to make your home safe and to help prevent falls. What can I do on the outside of my home?  Regularly fix the edges of walkways and driveways and fix any cracks.  Remove anything that might make you trip as you walk through a door, such as a raised step or threshold.  Trim any bushes or trees on the path to your home.  Use bright outdoor lighting.  Clear any walking paths of anything that might make someone trip, such as rocks or tools.  Regularly check to see if handrails are loose or broken. Make sure that both sides of any steps have handrails.  Any raised decks and porches should have guardrails on the edges.  Have any leaves, snow, or ice cleared regularly.  Use sand or salt on walking paths during winter.  Clean up any spills in your garage right away. This includes oil or grease spills. What can I do in the bathroom?  Use night lights.  Install grab bars by the toilet and in the tub and shower. Do not use towel bars as grab  bars.  Use non-skid mats or decals in the tub or shower.  If you need to sit down in the shower, use a plastic, non-slip stool.  Keep the floor dry. Clean up any water that spills on the floor as soon as it happens.  Remove soap buildup in the tub or shower regularly.  Attach bath mats securely with double-sided non-slip rug tape.  Do not have throw rugs and other things on the floor that can make you trip. What can I do in the bedroom?  Use night lights.  Make sure that you have a light by your bed that is easy to reach.  Do not use any sheets or blankets that are too big for your bed. They should not hang down onto the floor.  Have a firm chair that has side arms. You can use this for support while you get dressed.  Do not have throw rugs and other things on the floor that can make you trip. What can I do in the kitchen?  Clean up any spills right away.  Avoid walking on wet floors.  Keep items that you use a lot in easy-to-reach places.  If you need to reach something above you, use a strong step stool that has a grab bar.  Keep electrical cords out  of the way.  Do not use floor polish or wax that makes floors slippery. If you must use wax, use non-skid floor wax.  Do not have throw rugs and other things on the floor that can make you trip. What can I do with my stairs?  Do not leave any items on the stairs.  Make sure that there are handrails on both sides of the stairs and use them. Fix handrails that are broken or loose. Make sure that handrails are as long as the stairways.  Check any carpeting to make sure that it is firmly attached to the stairs. Fix any carpet that is loose or worn.  Avoid having throw rugs at the top or bottom of the stairs. If you do have throw rugs, attach them to the floor with carpet tape.  Make sure that you have a light switch at the top of the stairs and the bottom of the stairs. If you do not have them, ask someone to add them for  you. What else can I do to help prevent falls?  Wear shoes that:  Do not have high heels.  Have rubber bottoms.  Are comfortable and fit you well.  Are closed at the toe. Do not wear sandals.  If you use a stepladder:  Make sure that it is fully opened. Do not climb a closed stepladder.  Make sure that both sides of the stepladder are locked into place.  Ask someone to hold it for you, if possible.  Clearly mark and make sure that you can see:  Any grab bars or handrails.  First and last steps.  Where the edge of each step is.  Use tools that help you move around (mobility aids) if they are needed. These include:  Canes.  Walkers.  Scooters.  Crutches.  Turn on the lights when you go into a dark area. Replace any light bulbs as soon as they burn out.  Set up your furniture so you have a clear path. Avoid moving your furniture around.  If any of your floors are uneven, fix them.  If there are any pets around you, be aware of where they are.  Review your medicines with your doctor. Some medicines can make you feel dizzy. This can increase your chance of falling. Ask your doctor what other things that you can do to help prevent falls. This information is not intended to replace advice given to you by your health care provider. Make sure you discuss any questions you have with your health care provider. Document Released: 11/04/2008 Document Revised: 06/16/2015 Document Reviewed: 02/12/2014 Elsevier Interactive Patient Education  2017 [employer].

## 2019-02-19 NOTE — Progress Notes (Signed)
Subjective:   Desiree Carpenter is a 69 y.o. female who presents for Medicare Annual (Subsequent) preventive examination.  Virtual Visit via Video Note  I connected with Desiree Carpenter on 02/19/19 at 10:40 AM EST by a video enabled telemedicine application and verified that I am speaking with the correct person using two identifiers.  Location: Patient: home Provider: office   I discussed the limitations of evaluation and management by telemedicine and the availability of in person appointments. The patient expressed understanding and agreed to proceed.   Some vital signs may be absent or patient reported.   Clemetine Marker, LPN     Review of Systems:   Cardiac Risk Factors include: advanced age (>36men, >46 women);dyslipidemia;obesity (BMI >30kg/m2)     Objective:     Vitals: BP 129/81   Pulse (!) 57   Ht 5\' 3"  (1.6 m)   Wt 185 lb (83.9 kg)   BMI 32.77 kg/m   Body mass index is 32.77 kg/m.  Advanced Directives 02/19/2019 10/02/2018 03/31/2018 03/21/2018 03/17/2018 02/14/2018 02/28/2017  Does Patient Have a Medical Advance Directive? No No No No No Yes No  Does patient want to make changes to medical advance directive? No - Patient declined - - - - Yes (MAU/Ambulatory/Procedural Areas - Information given) -  Would patient like information on creating a medical advance directive? - No - Patient declined No - Patient declined No - Patient declined No - Patient declined - -    Tobacco Social History   Tobacco Use  Smoking Status Never Smoker  Smokeless Tobacco Never Used     Counseling given: Not Answered   Clinical Intake:  Pre-visit preparation completed: Yes  Pain : No/denies pain     BMI - recorded: 32.77 Nutritional Status: BMI > 30  Obese Nutritional Risks: None Diabetes: No  How often do you need to have someone help you when you read instructions, pamphlets, or other written materials from your doctor or pharmacy?: 1 - Never  Interpreter Needed?:  No  Information entered by :: Clemetine Marker LPN  Past Medical History:  Diagnosis Date  . Arthritis of both knees 02/27/2018  . Mild hyperlipidemia 02/27/2018  . Obesity 03/28/2016   Past Surgical History:  Procedure Laterality Date  . ABDOMINAL HYSTERECTOMY    . BUNIONECTOMY    . COLONOSCOPY WITH PROPOFOL N/A 02/28/2017   Procedure: COLONOSCOPY WITH PROPOFOL;  Surgeon: Jonathon Bellows, MD;  Location: [facility] ENDOSCOPY;  Service: Gastroenterology;  Laterality: N/A;  . COLONOSCOPY WITH PROPOFOL N/A 03/21/2018   Procedure: COLONOSCOPY WITH BIOPSY;  Surgeon: Lucilla Lame, MD;  Location: [place];  Service: Endoscopy;  Laterality: N/A;   Family History  Problem Relation Age of Onset  . Cancer Mother        breast (44), colon (82)  . Breast cancer Mother 34  . Cancer Father 45       colon  . Cancer Maternal Grandmother        breast  . Breast cancer Maternal Grandmother 64  . Heart disease Maternal Grandfather        heart failure  . Cancer Maternal Grandfather        lung  . Stroke Paternal Grandmother    Social History   Socioeconomic History  . Marital status: Married    Spouse name: Not on file  . Number of children: 1  . Years of education: Not on file  . Highest education level: Bachelor's degree (e.g., BA, AB, BS)  Occupational  History  . Occupation: retired  Tobacco Use  . Smoking status: Never Smoker  . Smokeless tobacco: Never Used  Substance and Sexual Activity  . Alcohol use: No    Alcohol/week: 0.0 standard drinks  . Drug use: No  . Sexual activity: Not Currently  Other Topics Concern  . Not on file  Social History Narrative   Pt has one child who lives in [place] and her mother recently came to live with her   Social Determinants of Health   Financial Resource Strain: Low Risk   . Difficulty of Paying Living Expenses: Not hard at all  Food Insecurity: No Food Insecurity  . Worried About Charity fundraiser in the Last Year: Never true  . Ran Out of  Food in the Last Year: Never true  Transportation Needs: No Transportation Needs  . Lack of Transportation (Medical): No  . Lack of Transportation (Non-Medical): No  Physical Activity: Insufficiently Active  . Days of Exercise per Week: 3 days  . Minutes of Exercise per Session: 30 min  Stress: No Stress Concern Present  . Feeling of Stress : Not at all  Social Connections: Slightly Isolated  . Frequency of Communication with Friends and Family: More than three times a week  . Frequency of Social Gatherings with Friends and Family: More than three times a week  . Attends Religious Services: More than 4 times per year  . Active Member of Clubs or Organizations: No  . Attends Archivist Meetings: Never  . Marital Status: Married    Outpatient Encounter Medications as of 02/19/2019  Medication Sig  . Ascorbic Acid (VITAMIN C PO) Take by mouth daily.  Marland Kitchen aspirin EC 81 MG tablet Take 1 tablet (81 mg total) daily by mouth.  . Multiple Vitamin (MULTIVITAMIN) tablet Take 1 tablet by mouth daily.  . pravastatin (PRAVACHOL) 20 MG tablet Take 1 tablet (20 mg total) by mouth daily.  . Pyridoxine HCl (VITAMIN B-6 PO) Take by mouth daily.  . [DISCONTINUED] meloxicam (MOBIC) 15 MG tablet Take 0.5-1 tablets (7.5-15 mg total) by mouth daily as needed for pain. Caution: take at least one hour AFTER aspirin   No facility-administered encounter medications on file as of 02/19/2019.    Activities of Daily Living In your present state of health, do you have any difficulty performing the following activities: 02/19/2019 09/01/2018  Hearing? N N  Comment declines hearing aids -  Vision? N N  Difficulty concentrating or making decisions? N N  Walking or climbing stairs? N N  Dressing or bathing? N N  Doing errands, shopping? N N  Preparing Food and eating ? N -  Using the Toilet? N -  In the past six months, have you accidently leaked urine? N -  Do you have problems with loss of bowel control?  N -  Managing your Medications? N -  Managing your Finances? N -  Housekeeping or managing your Housekeeping? N -  Some recent data might be hidden    Patient Care Team: Delsa Grana, PA-C as PCP - General (Family Medicine) Anell Barr, OD (Optometry)    Assessment:   This is a routine wellness examination for Desiree Carpenter.  Exercise Activities and Dietary recommendations Current Exercise Habits: Home exercise routine, Type of exercise: walking, Time (Minutes): 30, Frequency (Times/Week): 3, Weekly Exercise (Minutes/Week): 90, Intensity: Mild, Exercise limited by: None identified  Goals    . Increase physical activity     Pt would like to increase  physical activity to 150 minutes per week    . Weight (lb) < 168 lb (76.2 kg)       Fall Risk Fall Risk  02/19/2019 09/01/2018 02/27/2018 02/14/2018 08/13/2017  Falls in the past year? 0 0 0 0 No  Number falls in past yr: 0 0 0 0 -  Injury with Fall? 0 0 0 - -  Risk for fall due to : No Fall Risks - - - -  Follow up Falls prevention discussed - - Falls prevention discussed -   FALL RISK PREVENTION PERTAINING TO THE HOME:  Any stairs in or around the home? Yes  If so, do they handrails? Yes   Home free of loose throw rugs in walkways, pet beds, electrical cords, etc? Yes  Adequate lighting in your home to reduce risk of falls? Yes   ASSISTIVE DEVICES UTILIZED TO PREVENT FALLS:  Life alert? No  Use of a cane, walker or w/c? No  Grab bars in the bathroom? Yes  Shower chair or bench in shower? Yes  Elevated toilet seat or a handicapped toilet? No   DME ORDERS:  DME order needed?  No   TIMED UP AND GO:  Was the test performed? No . Virtual visit  Education: Fall risk prevention has been discussed.  Intervention(s) required? No   Depression Screen PHQ 2/9 Scores 02/19/2019 09/01/2018 02/27/2018 02/14/2018  PHQ - 2 Score 0 0 0 0  PHQ- 9 Score - 0 0 -     Cognitive Function     6CIT Screen 02/19/2019 02/14/2018  What Year? 0  points 0 points  What month? 0 points 0 points  What time? 0 points 0 points  Count back from 20 0 points 0 points  Months in reverse 0 points 0 points  Repeat phrase 0 points 0 points  Total Score 0 0    Immunization History  Administered Date(s) Administered  . Fluad Quad(high Dose 65+) 10/27/2018  . Influenza, High Dose Seasonal PF 10/11/2017  . Influenza,inj,Quad PF,6+ Mos 10/19/2014  . Influenza-Unspecified 10/11/2017  . Pneumococcal Conjugate-13 05/06/2017  . Pneumococcal Polysaccharide-23 09/01/2018    Qualifies for Shingles Vaccine? Yes  . Due for Shingrix. Education has been provided regarding the importance of this vaccine. Pt has been advised to call insurance company to determine out of pocket expense. Advised may also receive vaccine at local pharmacy or Health Dept. Verbalized acceptance and understanding.  Tdap: Up to date  Flu Vaccine: Up to date  Pneumococcal Vaccine: Up to date   Screening Tests Health Maintenance  Topic Date Due  . MAMMOGRAM  03/22/2019  . TETANUS/TDAP  10/04/2020  . COLONOSCOPY  03/22/2023  . INFLUENZA VACCINE  Completed  . DEXA SCAN  Completed  . Hepatitis C Screening  Completed  . PNA vac Low Risk Adult  Completed    Cancer Screenings:  Colorectal Screening: Completed 03/21/18. Repeat every 5 years;   Mammogram: Completed 09/22/18. Repeat every year.   Bone Density: Completed 01/31/12. Results reflect NORMAL. Repeat every 2 years. Ordered today. Pt provided with contact information and advised to call to schedule appt.   Lung Cancer Screening: (Low Dose CT Chest recommended if Age 26-80 years, 30 pack-year currently smoking OR have quit w/in 15years.) does not qualify.   Additional Screening:  Hepatitis C Screening: does qualify; Completed 03/28/16  Vision Screening: Recommended annual ophthalmology exams for early detection of glaucoma and other disorders of the eye. Is the patient up to date with their annual eye  exam?  Yes    Who is the provider or what is the name of the office in which the pt attends annual eye exams? Dr. Ellin Mayhew  Dental Screening: Recommended annual dental exams for proper oral hygiene  Community Resource Referral:  CRR required this visit?  No      Plan:     I have personally reviewed and addressed the Medicare Annual Wellness questionnaire and have noted the following in the patient's chart:  A. Medical and social history B. Use of alcohol, tobacco or illicit drugs  C. Current medications and supplements D. Functional ability and status E.  Nutritional status F.  Physical activity G. Advance directives H. List of other physicians I.  Hospitalizations, surgeries, and ER visits in previous 12 months J.  [place] such as hearing and vision if needed, cognitive and depression L. Referrals and appointments   In addition, I have reviewed and discussed with patient certain preventive protocols, quality metrics, and best practice recommendations. A written personalized care plan for preventive services as well as general preventive health recommendations were provided to patient.   Signed,  Clemetine Marker, LPN Nurse Health Advisor   Nurse Notes: none

## 2019-02-22 ENCOUNTER — Other Ambulatory Visit: Payer: Self-pay | Admitting: Family Medicine

## 2019-02-25 DIAGNOSIS — R69 Illness, unspecified: Secondary | ICD-10-CM | POA: Diagnosis not present

## 2019-03-02 ENCOUNTER — Ambulatory Visit: Payer: Medicare HMO | Admitting: Family Medicine

## 2019-03-10 ENCOUNTER — Encounter: Payer: Self-pay | Admitting: Family Medicine

## 2019-03-10 ENCOUNTER — Ambulatory Visit (INDEPENDENT_AMBULATORY_CARE_PROVIDER_SITE_OTHER): Payer: Medicare HMO | Admitting: Family Medicine

## 2019-03-10 ENCOUNTER — Other Ambulatory Visit: Payer: Self-pay

## 2019-03-10 VITALS — BP 122/72 | HR 93 | Temp 97.9°F | Resp 14 | Ht 63.0 in | Wt 191.5 lb

## 2019-03-10 DIAGNOSIS — D581 Hereditary elliptocytosis: Secondary | ICD-10-CM

## 2019-03-10 DIAGNOSIS — E785 Hyperlipidemia, unspecified: Secondary | ICD-10-CM

## 2019-03-10 DIAGNOSIS — Z5181 Encounter for therapeutic drug level monitoring: Secondary | ICD-10-CM

## 2019-03-10 DIAGNOSIS — R196 Halitosis: Secondary | ICD-10-CM | POA: Diagnosis not present

## 2019-03-10 HISTORY — DX: Hereditary elliptocytosis: D58.1

## 2019-03-10 MED ORDER — PRAVASTATIN SODIUM 20 MG PO TABS: 20.0000 mg | ORAL_TABLET | Freq: Every day | ORAL | 3 refills | Status: DC

## 2019-03-10 MED ORDER — PANTOPRAZOLE SODIUM 20 MG PO TBEC: 20.0000 mg | DELAYED_RELEASE_TABLET | Freq: Every day | ORAL | 1 refills | Status: DC

## 2019-03-10 NOTE — Progress Notes (Signed)
Name: DILLON ALDIS   MRN: [id number]    DOB: September 18, 1950   Date:03/10/2019       Progress Note  Chief Complaint  Patient presents with  . Follow-up  . Hyperlipidemia     Subjective:   Desiree Carpenter is a 69 y.o. female, presents to clinic for routine follow up on the conditions listed above.  Hyperlipidemia:  Current Medication Regimen: Pravastatin 20 mg daily at bedtime, patient endorses excellent compliance Last Lipids: Lab Results  Component Value Date   CHOL 151 10/14/2018   HDL 62 10/14/2018   LDLCALC 74 10/14/2018   TRIG 72 10/14/2018   CHOLHDL 2.4 10/14/2018   - Current Diet: Healthy diet - Denies: Chest pain, shortness of breath, myalgias.,  No claudication symptoms, change to lower extremity skin hair or appearance no pallor or neuropathy - Documented aortic atherosclerosis? No - Risk factors for atherosclerosis: hypercholesterolemia  Patient additionally has concerns about halitosis.  She states that she was concerned about it being from her dental hygiene but she has gone to the dentist and been checked out and her teeth and gums are very healthy.  She inquired what else she can do to treat this.  She denies any dysphagia, indigestion, acid reflux, regurgitation of food.  She states she drinks plenty of water and general has a very healthy diet lifestyle  Patient Active Problem List   Diagnosis Date Noted  . Arthritis of both knees 02/27/2018  . Mild hyperlipidemia 02/27/2018  . Family hx-breast malignancy 03/28/2016  . Obesity 03/28/2016    Past Surgical History:  Procedure Laterality Date  . ABDOMINAL HYSTERECTOMY    . BUNIONECTOMY    . COLONOSCOPY WITH PROPOFOL N/A 02/28/2017   Procedure: COLONOSCOPY WITH PROPOFOL;  Surgeon: Jonathon Bellows, MD;  Location: [facility] ENDOSCOPY;  Service: Gastroenterology;  Laterality: N/A;  . COLONOSCOPY WITH PROPOFOL N/A 03/21/2018   Procedure: COLONOSCOPY WITH BIOPSY;  Surgeon: Lucilla Lame, MD;  Location: [facility];  Service: Endoscopy;  Laterality: N/A;    Family History  Problem Relation Age of Onset  . Cancer Mother        breast (42), colon (1)  . Breast cancer Mother 42  . Cancer Father 69       colon  . Cancer Maternal Grandmother        breast  . Breast cancer Maternal Grandmother 54  . Heart disease Maternal Grandfather        heart failure  . Cancer Maternal Grandfather        lung  . Stroke Paternal Grandmother     Social History   Tobacco Use  . Smoking status: Never Smoker  . Smokeless tobacco: Never Used  Substance Use Topics  . Alcohol use: No    Alcohol/week: 0.0 standard drinks  . Drug use: No      Current Outpatient Medications:  .  Ascorbic Acid (VITAMIN C PO), Take by mouth daily., Disp: , Rfl:  .  aspirin EC 81 MG tablet, Take 1 tablet (81 mg total) daily by mouth., Disp: , Rfl:  .  Multiple Vitamin (MULTIVITAMIN) tablet, Take 1 tablet by mouth daily., Disp: , Rfl:  .  pravastatin (PRAVACHOL) 20 MG tablet, TAKE 1 TABLET BY MOUTH EVERY DAY, Disp: 90 tablet, Rfl: 0 .  Pyridoxine HCl (VITAMIN B-6 PO), Take by mouth daily., Disp: , Rfl:   Allergies  Allergen Reactions  . Atorvastatin     Constipation    Chart Review Today: I personally  reviewed active problem list, medication list, allergies, family history, social history, health maintenance, notes from last encounter, lab results, imaging with the patient/caregiver today.   Review of Systems  Constitutional: Negative.   HENT: Negative.   Eyes: Negative.   Respiratory: Negative.   Cardiovascular: Negative.   Gastrointestinal: Negative.   Endocrine: Negative.   Genitourinary: Negative.   Musculoskeletal: Negative.   Skin: Negative.   Allergic/Immunologic: Negative.   Neurological: Negative.   Hematological: Negative.   Psychiatric/Behavioral: Negative.   All other systems reviewed and are negative.    Objective:    Vitals:   03/10/19 1155  BP: 122/72  Pulse: 93  Resp: 14    Temp: 97.9 F (36.6 C)  SpO2: 95%  Weight: 191 lb 8 oz (86.9 kg)  Height: 5\' 3"  (1.6 m)    Body mass index is 33.92 kg/m.  Physical Exam Vitals and nursing note reviewed.  Constitutional:      General: She is not in acute distress.    Appearance: Normal appearance. She is well-developed. She is not ill-appearing, toxic-appearing or diaphoretic.     Interventions: Face mask in place.  HENT:     Head: Normocephalic and atraumatic.     Right Ear: External ear normal.     Left Ear: External ear normal.     Nose: Nose normal.     Mouth/Throat:     Mouth: Mucous membranes are dry.     Pharynx: Oropharynx is clear. No oropharyngeal exudate.  Eyes:     General: Lids are normal. No scleral icterus.       Right eye: No discharge.        Left eye: No discharge.     Conjunctiva/sclera: Conjunctivae normal.  Neck:     Trachea: Phonation normal. No tracheal deviation.  Cardiovascular:     Rate and Rhythm: Normal rate and regular rhythm.     Pulses: Normal pulses.          Radial pulses are 2+ on the right side and 2+ on the left side.       Posterior tibial pulses are 2+ on the right side and 2+ on the left side.     Heart sounds: Normal heart sounds. No murmur. No friction rub. No gallop.   Pulmonary:     Effort: Pulmonary effort is normal. No respiratory distress.     Breath sounds: Normal breath sounds. No stridor. No wheezing, rhonchi or rales.  Chest:     Chest wall: No tenderness.  Abdominal:     General: Bowel sounds are normal. There is no distension.     Palpations: Abdomen is soft.     Tenderness: There is no abdominal tenderness. There is no guarding or rebound.  Musculoskeletal:        General: No deformity. Normal range of motion.     Cervical back: Normal range of motion and neck supple.     Right lower leg: No edema.     Left lower leg: No edema.  Lymphadenopathy:     Cervical: No cervical adenopathy.  Skin:    General: Skin is warm and dry.     Capillary  Refill: Capillary refill takes less than 2 seconds.     Coloration: Skin is not jaundiced or pale.     Findings: No rash.  Neurological:     Mental Status: She is alert and oriented to person, place, and time.     Motor: No abnormal muscle tone.  Gait: Gait normal.  Psychiatric:        Mood and Affect: Mood normal.        Speech: Speech normal.        Behavior: Behavior normal.     PHQ2/9: Depression screen [facility] 2/9 03/10/2019 02/19/2019 09/01/2018 02/27/2018 02/14/2018  Decreased Interest 0 0 0 0 0  Down, Depressed, Hopeless 0 0 0 0 0  PHQ - 2 Score 0 0 0 0 0  Altered sleeping 0 - 0 0 -  Tired, decreased energy 0 - 0 0 -  Change in appetite 0 - 0 0 -  Feeling bad or failure about yourself  0 - 0 0 -  Trouble concentrating 0 - 0 0 -  Moving slowly or fidgety/restless 0 - 0 0 -  Suicidal thoughts 0 - 0 0 -  PHQ-9 Score 0 - 0 0 -  Difficult doing work/chores Not difficult at all - Not difficult at all Not difficult at all -    phq 9 is neg, reviewed today  Fall Risk: Fall Risk  03/10/2019 02/19/2019 09/01/2018 02/27/2018 02/14/2018  Falls in the past year? 0 0 0 0 0  Number falls in past yr: 0 0 0 0 0  Injury with Fall? 0 0 0 0 -  Risk for fall due to : - No Fall Risks - - -  Follow up - Falls prevention discussed - - Falls prevention discussed    Functional Status Survey: Is the patient deaf or have difficulty hearing?: No Does the patient have difficulty seeing, even when wearing glasses/contacts?: No Does the patient have difficulty concentrating, remembering, or making decisions?: No Does the patient have difficulty walking or climbing stairs?: No Does the patient have difficulty dressing or bathing?: No Does the patient have difficulty doing errands alone such as visiting a doctor's office or shopping?: No   Assessment & Plan:     ICD-10-CM   1. 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 Halitosis  R19.6    Continue dental hygiene, push fluids, can try PPI trial  4. Medication monitoring encounter  Z51.81 CBC with Differential/Platelet    COMPLETE METABOLIC PANEL WITH GFR    Lipid panel     Return in about 1 year (around 03/09/2020) for Routine follow-up and MWV.   Delsa Grana, PA-C 03/10/19 12:10 PM

## 2019-03-10 NOTE — Patient Instructions (Addendum)
Preventing Osteoporosis, Adult Osteoporosis is a condition that causes the bones to lose density. This means that the bones become thinner, and the normal spaces in bone tissue become larger. Low bone density can make the bones weak and cause them to break more easily. Osteoporosis cannot always be prevented, but you can take steps to lower your risk of developing this condition. How can this condition affect me? If you develop osteoporosis, you will be more likely to break bones in your wrist, spine, or hip. Even a minor accident or injury can be enough to break weak bones. The bones will also be slower to heal. Osteoporosis can cause other problems as well, such as a stooped posture or trouble with movement. Osteoporosis can occur with aging. As you get older, you may lose bone tissue more quickly, or it may be replaced more slowly. Osteoporosis is more likely to develop if you have poor nutrition or do not get enough calcium or vitamin D. Other lifestyle factors can also play a role. By eating a well-balanced diet and making lifestyle changes, you can help keep your bones strong and healthy, lowering your chances of developing osteoporosis. What can increase my risk? The following factors may make you more likely to develop osteoporosis:  Having a family history of the condition.  Having poor nutrition or not getting enough calcium or vitamin D.  Using certain medicines, such as steroid medicines or antiseizure medicines.  Being any of the following: ? 91 years of age or older. ? Female. ? A woman who has gone through menopause (is postmenopausal). ? White (Caucasian) or of Asian descent.  Smoking or having a history of smoking.  Not being physically active (being sedentary).  Having a small body frame. What actions can I take to prevent this?  Get enough calcium   Make sure you get enough calcium every day. Calcium is the most important mineral for bone health. Most people can get  enough calcium from their diet, but supplements may be recommended for people who are at risk for osteoporosis. Follow these guidelines: ? If you are age 44 or younger, aim to get 1,000 mg of calcium every day. ? If you are older than age 49, aim to get 1,200 mg of calcium every day.  Good sources of calcium include: ? Dairy products, such as low-fat or nonfat milk, cheese, and yogurt. ? Dark green leafy vegetables, such as bok choy and broccoli. ? Foods that have had calcium added to them (calcium-fortified foods), such as orange juice, cereal, bread, soy beverages, and tofu products. ? Nuts, such as almonds.  Check nutrition labels to see how much calcium is in a food or drink. Get enough vitamin D  Try to get enough vitamin D every day. Vitamin D is the most essential vitamin for bone health. It helps the body absorb calcium. Follow these guidelines for how much vitamin D to get from food: ? If you are age 106 or younger, aim to get at least 600 international units (IU) every day. Your health care provider may suggest more. ? If you are older than age 64, aim to get at least 800 international units every day. Your health care provider may suggest more.  Good sources of vitamin D in your diet include: ? Egg yolks. ? Oily fish, such as salmon, sardines, and tuna. ? Milk and cereal fortified with vitamin D.  Your body also makes vitamin D when you are out in the sun. Exposing the bare  skin on your face, arms, legs, or back to the sun for no more than 30 minutes a day, 2 times a week is more than enough. Beyond that, make sure you use sunblock to protect your skin from sunburn, which increases your risk for skin cancer. Exercise  Stay active and get exercise every day.  Ask your health care provider what types of exercise are best for you. Weight-bearing and strength-building activities are important for building and maintaining healthy bones. Some examples of these types of activities  include: ? Walking and hiking. ? Jogging and running. ? Dancing. ? Gym exercises. ? Lifting weights. ? Tennis and racquetball. ? Climbing stairs. ? Aerobics. Make other lifestyle changes  Do not use any products that contain nicotine or tobacco, such as cigarettes, e-cigarettes, and chewing tobacco. If you need help quitting, ask your health care provider.  Lose weight if you are overweight.  If you drink alcohol: ? Limit how much you use to:  0-1 drink a day for nonpregnant women.  0-2 drinks a day for men. ? Be aware of how much alcohol is in your drink. In the [place] one drink equals one 12 oz bottle of beer (355 mL), one 5 oz glass of wine (148 mL), or one 1 oz glass of hard liquor (44 mL). Where to find support If you need help making changes to prevent osteoporosis, talk with your health care provider. You can ask for a referral to a diet and nutrition specialist (dietitian) and a physical therapist. Where to find more information Learn more about osteoporosis from:  NIH Osteoporosis and Related [place]: www.bones.SouthExposed.es  [place] Office on [employer]: [place]s.tn  [place]: EquipmentWeekly.com.ee Summary  Osteoporosis is a condition that causes weak bones that are more likely to break.  Eat a healthy diet, making sure you get enough calcium and vitamin D, and stay active by getting regular exercise to help prevent osteoporosis.  Other ways to reduce your risk of osteoporosis include maintaining a healthy weight and avoiding alcohol and products that contain nicotine or tobacco. This information is not intended to replace advice given to you by your health care provider. Make sure you discuss any questions you have with your health care provider. Document Revised: 08/08/2018 Document Reviewed: 08/08/2018 Elsevier Patient Education  [place].     Please do call to schedule your mammogram & DEXA scan;  the number to schedule one at either [facility] or [facility] is [phone]   For halitosis you can try protonix in the morning before breakfast daily for 2-3 weeks to see if that helps with your breath.     Halitosis Halitosis is bad breath. Halitosis may be caused by:  Foods and beverages.  Poor mouth care (oral hygiene).  Medical conditions, such as sinus infections, mouth infections, diabetes, and liver or kidney disease.  Medicines that dry out your mouth.  Smoking. Follow these instructions at home: Oral hygiene         Floss at least once a day. Ask your dentist to show you the best way to floss.  Brush your teeth at least two times a day. Use the toothpaste that your dentist recommends. Ask your dentist to show you the best way to brush your teeth.  Brush your tongue when you brush your teeth. This may help to improve your breath.  Rinse your mouth at least once a day. Use the mouthwash that your dentist recommends.  Visit your dentist for a routine cleaning at least twice a year. Eating and drinking  Drink enough fluid to keep your urine pale yellow.  Eat foods that help to keep your teeth clean, such as carrots and celery.  Avoid foods and drinks that can lead to bad breath, such as: ? Garlic. ? Onions. ? Fish. ? Meats. ? Coffee. ? Alcohol. General instructions  Do not use any products that contain nicotine or tobacco, such as cigarettes and e-cigarettes. These products can make bad breath worse. If you need help quitting, ask your health care provider.  If you wear mouth devices such as dentures or a retainer, make sure you wear and clean your device properly.  If you have a dry mouth, try chewing gum or mints that do not contain sugar. Chewing gum or sucking on mints can trigger saliva production. Contact a health care provider if:  You develop new symptoms, such as bleeding gums or pain.  Your symptoms get worse  or do not improve with home care. Summary  Halitosis is bad breath. This has many possible causes, such as certain foods and beverages.  Make sure you have good oral hygiene. Also keep any oral devices, such as retainers, clean.  Drink enough fluid to keep your urine pale yellow.  Avoid foods and drinks that can lead to bad breath, such as garlic and onions.  Do not use any products that contain nicotine or tobacco, such as cigarettes and e-cigarettes. This information is not intended to replace advice given to you by your health care provider. Make sure you discuss any questions you have with your health care provider. Document Revised: 01/10/2017 Document Reviewed: 10/23/2016 Elsevier Patient Education  2020 [employer].

## 2019-03-11 LAB — CBC WITH DIFFERENTIAL/PLATELET
Absolute Monocytes: 480 cells/uL (ref 200–950)
Basophils Absolute: 20 cells/uL (ref 0–200)
Basophils Relative: 0.4 %
Eosinophils Absolute: 50 cells/uL (ref 15–500)
Eosinophils Relative: 1 %
HCT: 41.3 % (ref 35.0–45.0)
Hemoglobin: 13.3 g/dL (ref 11.7–15.5)
Lymphs Abs: 1625 cells/uL (ref 850–3900)
MCH: 27.5 pg (ref 27.0–33.0)
MCHC: 32.2 g/dL (ref 32.0–36.0)
MCV: 85.5 fL (ref 80.0–100.0)
MPV: 12.9 fL — ABNORMAL HIGH (ref 7.5–12.5)
Monocytes Relative: 9.6 %
Neutro Abs: 2825 cells/uL (ref 1500–7800)
Neutrophils Relative %: 56.5 %
Platelets: 209 10*3/uL (ref 140–400)
RBC: 4.83 10*6/uL (ref 3.80–5.10)
RDW: 12.3 % (ref 11.0–15.0)
Total Lymphocyte: 32.5 %
WBC: 5 10*3/uL (ref 3.8–10.8)

## 2019-03-11 LAB — COMPLETE METABOLIC PANEL WITH GFR
AG Ratio: 1.7 (calc) (ref 1.0–2.5)
ALT: 20 U/L (ref 6–29)
AST: 20 U/L (ref 10–35)
Albumin: 4.4 g/dL (ref 3.6–5.1)
Alkaline phosphatase (APISO): 60 U/L (ref 37–153)
BUN: 13 mg/dL (ref 7–25)
CO2: 29 mmol/L (ref 20–32)
Calcium: 10.4 mg/dL (ref 8.6–10.4)
Chloride: 106 mmol/L (ref 98–110)
Creat: 0.72 mg/dL (ref 0.50–0.99)
GFR, Est African American: 100 mL/min/{1.73_m2} (ref >=60)
GFR, Est Non African American: 87 mL/min/{1.73_m2} (ref >=60)
Globulin: 2.6 g/dL (calc) (ref 1.9–3.7)
Glucose, Bld: 76 mg/dL (ref 65–99)
Potassium: 4.7 mmol/L (ref 3.5–5.3)
Sodium: 140 mmol/L (ref 135–146)
Total Bilirubin: 0.4 mg/dL (ref 0.2–1.2)
Total Protein: 7 g/dL (ref 6.1–8.1)

## 2019-03-11 LAB — LIPID PANEL
Cholesterol: 152 mg/dL (ref <200)
HDL: 65 mg/dL (ref >=50)
LDL Cholesterol (Calc): 71 mg/dL (calc)
Non-HDL Cholesterol (Calc): 87 mg/dL (calc) (ref <130)
Total CHOL/HDL Ratio: 2.3 (calc) (ref <5.0)
Triglycerides: 79 mg/dL (ref <150)

## 2019-03-21 DIAGNOSIS — Z23 Encounter for immunization: Secondary | ICD-10-CM | POA: Diagnosis not present

## 2019-04-04 ENCOUNTER — Other Ambulatory Visit: Payer: Self-pay | Admitting: Family Medicine

## 2019-04-06 NOTE — Telephone Encounter (Signed)
Refill request for general medication.  Last office visit:03/10/19  No follow-ups on file.  Im confused how is this written for April?

## 2019-04-07 DIAGNOSIS — E669 Obesity, unspecified: Secondary | ICD-10-CM | POA: Diagnosis not present

## 2019-04-07 DIAGNOSIS — E785 Hyperlipidemia, unspecified: Secondary | ICD-10-CM | POA: Diagnosis not present

## 2019-04-18 DIAGNOSIS — Z23 Encounter for immunization: Secondary | ICD-10-CM | POA: Diagnosis not present

## 2019-04-29 ENCOUNTER — Other Ambulatory Visit: Payer: Self-pay | Admitting: Family Medicine

## 2019-05-14 DIAGNOSIS — Z1159 Encounter for screening for other viral diseases: Secondary | ICD-10-CM | POA: Diagnosis not present

## 2019-05-14 DIAGNOSIS — Z1152 Encounter for screening for COVID-19: Secondary | ICD-10-CM | POA: Diagnosis not present

## 2019-05-14 DIAGNOSIS — Z03818 Encounter for observation for suspected exposure to other biological agents ruled out: Secondary | ICD-10-CM | POA: Diagnosis not present

## 2019-06-06 ENCOUNTER — Other Ambulatory Visit: Payer: Self-pay | Admitting: Family Medicine

## 2019-07-06 ENCOUNTER — Other Ambulatory Visit: Payer: Self-pay | Admitting: Family Medicine

## 2019-08-03 ENCOUNTER — Other Ambulatory Visit: Payer: Self-pay | Admitting: Family Medicine

## 2019-08-26 DIAGNOSIS — R69 Illness, unspecified: Secondary | ICD-10-CM | POA: Diagnosis not present

## 2019-08-31 ENCOUNTER — Other Ambulatory Visit: Payer: Self-pay | Admitting: Family Medicine

## 2019-09-11 ENCOUNTER — Other Ambulatory Visit: Payer: Self-pay | Admitting: Family Medicine

## 2019-09-11 DIAGNOSIS — Z1231 Encounter for screening mammogram for malignant neoplasm of breast: Secondary | ICD-10-CM

## 2019-09-23 ENCOUNTER — Ambulatory Visit
Admission: RE | Admit: 2019-09-23 | Discharge: 2019-09-23 | Disposition: A | Discharge status: Home / Self Care | Payer: Medicare HMO | Source: Ambulatory Visit | Attending: Family Medicine | Admitting: Family Medicine

## 2019-09-23 ENCOUNTER — Other Ambulatory Visit: Payer: Self-pay

## 2019-09-23 DIAGNOSIS — Z1231 Encounter for screening mammogram for malignant neoplasm of breast: Secondary | ICD-10-CM | POA: Insufficient documentation

## 2019-10-06 ENCOUNTER — Other Ambulatory Visit: Payer: Self-pay | Admitting: Family Medicine

## 2019-11-06 ENCOUNTER — Other Ambulatory Visit: Payer: Self-pay | Admitting: Family Medicine

## 2019-11-18 DIAGNOSIS — R69 Illness, unspecified: Secondary | ICD-10-CM | POA: Diagnosis not present

## 2019-11-29 ENCOUNTER — Other Ambulatory Visit: Payer: Self-pay | Admitting: Family Medicine

## 2020-01-06 ENCOUNTER — Other Ambulatory Visit: Payer: Self-pay

## 2020-01-06 ENCOUNTER — Encounter: Payer: Self-pay | Admitting: Internal Medicine

## 2020-01-06 ENCOUNTER — Ambulatory Visit (INDEPENDENT_AMBULATORY_CARE_PROVIDER_SITE_OTHER): Payer: Medicare HMO | Admitting: Internal Medicine

## 2020-01-06 VITALS — BP 120/72 | HR 97 | Temp 98.0°F | Resp 16 | Ht 63.0 in | Wt 187.8 lb

## 2020-01-06 DIAGNOSIS — M545 Low back pain, unspecified: Secondary | ICD-10-CM

## 2020-01-06 NOTE — Progress Notes (Signed)
Patient ID: Desiree Carpenter, female    DOB: 08/01/1950, 69 y.o.   MRN: [id number]  PCP: Delsa Grana, PA-C  Chief Complaint  Patient presents with  . Back Pain    Subjective:   Desiree Carpenter is a 69 y.o. female, presents to clinic with CC of the following:  Chief Complaint  Patient presents with  . Back Pain    HPI:  Patient is a 69 year old female patient of Delsa Grana Last visit with her was in February 2021 Follows up today with back pain.  Patient notes the back pain has been intermittent in the past couple weeks, and more in the lower back area. She notes this morning she was bending and doing activities and had no discomfort. No acute trauma prior to onset, she does often help with primary care activities with her mother, and does bending and lifting fairly routinely, with no one traumatic episode before she started to have discomfort  No pain radiates down the legs No numbness, tingling or marked weakness in LE's, no foot drop No bowel or bladder dysfunction symptoms, no saddle anesthesia symptoms Denies any dysuria, frequency, or blood in the urine. No fevers or other infectious symptoms of concern Has tried extra strength Tylenol and topical creams or oil products to help,   No h/o major prior back injury/fracture/surgery No cancer history No diabetes history        Patient Active Problem List   Diagnosis Date Noted  . 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 Arthritis of both knees 02/27/2018  . Mild hyperlipidemia 02/27/2018  . Family hx-breast malignancy 03/28/2016  . Obesity 03/28/2016      Current Outpatient Medications:  .  Ascorbic Acid (VITAMIN C PO), Take by mouth daily., Disp: , Rfl:  .  aspirin EC 81 MG tablet, Take 1 tablet (81 mg total) daily by mouth., Disp: , Rfl:  .  Multiple Vitamin (MULTIVITAMIN) tablet, Take 1 tablet by mouth daily., Disp: , Rfl:  .  pravastatin (PRAVACHOL) 20 MG tablet, Take 1 tablet (20 mg  total) by mouth daily. At bedtime, Disp: 90 tablet, Rfl: 3 .  Pyridoxine HCl (VITAMIN B-6 PO), Take by mouth daily., Disp: , Rfl:    Allergies  Allergen Reactions  . Atorvastatin     Constipation     Past Surgical History:  Procedure Laterality Date  . ABDOMINAL HYSTERECTOMY    . BUNIONECTOMY    . COLONOSCOPY WITH PROPOFOL N/A 02/28/2017   Procedure: COLONOSCOPY WITH PROPOFOL;  Surgeon: Jonathon Bellows, MD;  Location: [facility] ENDOSCOPY;  Service: Gastroenterology;  Laterality: N/A;  . COLONOSCOPY WITH PROPOFOL N/A 03/21/2018   Procedure: COLONOSCOPY WITH BIOPSY;  Surgeon: Lucilla Lame, MD;  Location: [place];  Service: Endoscopy;  Laterality: N/A;     Family History  Problem Relation Age of Onset  . Cancer Mother        breast (50), colon (34)  . Breast cancer Mother 83  . Cancer Father 77       colon  . Cancer Maternal Grandmother        breast  . Breast cancer Maternal Grandmother 12  . Heart disease Maternal Grandfather        heart failure  . Cancer Maternal Grandfather        lung  . Stroke Paternal Grandmother      Social History   Tobacco Use  . Smoking status: Never Smoker  . Smokeless tobacco: Never Used  Substance Use Topics  .  Alcohol use: No    Alcohol/week: 0.0 standard drinks    With staff assistance, above reviewed with the patient today.  ROS: As per HPI, otherwise no specific complaints on a limited and focused system review   No results found for this or any previous visit (from the past 72 hour(s)).   PHQ2/9: Depression screen [facility] 2/9 01/06/2020 03/10/2019 02/19/2019 09/01/2018 02/27/2018  Decreased Interest 0 0 0 0 0  Down, Depressed, Hopeless 0 0 0 0 0  PHQ - 2 Score 0 0 0 0 0  Altered sleeping - 0 - 0 0  Tired, decreased energy - 0 - 0 0  Change in appetite - 0 - 0 0  Feeling bad or failure about yourself  - 0 - 0 0  Trouble concentrating - 0 - 0 0  Moving slowly or fidgety/restless - 0 - 0 0  Suicidal thoughts - 0 - 0 0  PHQ-9  Score - 0 - 0 0  Difficult doing work/chores - Not difficult at all - Not difficult at all Not difficult at all   PHQ-2/9 Result is neg  Fall Risk: Fall Risk  01/06/2020 03/10/2019 02/19/2019 09/01/2018 02/27/2018  Falls in the past year? 0 0 0 0 0  Number falls in past yr: 0 0 0 0 0  Injury with Fall? 0 0 0 0 0  Risk for fall due to : - - No Fall Risks - -  Follow up - - Falls prevention discussed - -      Objective:   Vitals:   01/06/20 1046  BP: 120/72  Pulse: 97  Resp: 16  Temp: 98 F (36.7 C)  TempSrc: Oral  SpO2: 98%  Weight: 187 lb 12.8 oz (85.2 kg)  Height: 5\' 3"  (1.6 m)    Body mass index is 33.27 kg/m.  Physical Exam   NAD, masked, very pleasant and comfortable at rest HEENT - [place]/AT, sclera anicteric, positive glasses, PERRL, conj -noninjected Neck - supple, no adenopathy, no rigidity, nontender down the cervical spine with palpation Abd - soft, NT diffusely, ND, mildly obese, no obvious masses Back - no CVA tenderness, nontender to palpate down the lumbar spine and in the lumbar muscle areas adjacent bilateral where she feels the discomfort intermittently, no spasm, no bruising Straight leg raise negative bilaterally Neuro/psychiatric - affect was not flat, appropriate with conversation  Alert and oriented  Grossly non-focal - good strength on testing lower extremities, including with dorsi and plantarflexion of the feet, sensation intact to LT in distal lower extremities, DTRs 2+ and equal in the patella and Achilles  Speech normal   Results for orders placed or performed in visit on 03/10/19  CBC with Differential/Platelet  Result Value Ref Range   WBC 5.0 3.8 - 10.8 Thousand/uL   RBC 4.83 3.80 - 5.10 Million/uL   Hemoglobin 13.3 11.7 - 15.5 g/dL   HCT 41.3 35.0 - 45.0 %   MCV 85.5 80.0 - 100.0 fL   MCH 27.5 27.0 - 33.0 pg   MCHC 32.2 32.0 - 36.0 g/dL   RDW 12.3 11.0 - 15.0 %   Platelets 209 140 - 400 Thousand/uL   MPV 12.9 (H) 7.5 - 12.5 fL   Neutro  Abs 2,825 1,500 - 7,800 cells/uL   Lymphs Abs 1,625 850 - 3,900 cells/uL   Absolute Monocytes 480 200 - 950 cells/uL   Eosinophils Absolute 50 15 - 500 cells/uL   Basophils Absolute 20 0 - 200 cells/uL   Neutrophils Relative %  56.5 %   Total Lymphocyte 32.5 %   Monocytes Relative 9.6 %   Eosinophils Relative 1.0 %   Basophils Relative 0.4 %  COMPLETE METABOLIC PANEL WITH GFR  Result Value Ref Range   Glucose, Bld 76 65 - 99 mg/dL   BUN 13 7 - 25 mg/dL   Creat 0.72 0.50 - 0.99 mg/dL   GFR, Est Non African American 87 > OR = 60 mL/min/1.43m2   GFR, Est African American 100 > OR = 60 mL/min/1.90m2   BUN/Creatinine Ratio NOT APPLICABLE 6 - 22 (calc)   Sodium 140 135 - 146 mmol/L   Potassium 4.7 3.5 - 5.3 mmol/L   Chloride 106 98 - 110 mmol/L   CO2 29 20 - 32 mmol/L   Calcium 10.4 8.6 - 10.4 mg/dL   Total Protein 7.0 6.1 - 8.1 g/dL   Albumin 4.4 3.6 - 5.1 g/dL   Globulin 2.6 1.9 - 3.7 g/dL (calc)   AG Ratio 1.7 1.0 - 2.5 (calc)   Total Bilirubin 0.4 0.2 - 1.2 mg/dL   Alkaline phosphatase (APISO) 60 37 - 153 U/L   AST 20 10 - 35 U/L   ALT 20 6 - 29 U/L  Lipid panel  Result Value Ref Range   Cholesterol 152 <200 mg/dL   HDL 65 > OR = 50 mg/dL   Triglycerides 79 <150 mg/dL   LDL Cholesterol (Calc) 71 mg/dL (calc)   Total CHOL/HDL Ratio 2.3 <5.0 (calc)   Non-HDL Cholesterol (Calc) 87 <130 mg/dL (calc)       Assessment & Plan:   1. Acute bilateral low back pain without sciatica Educated, discussed this is likely a mechanical source, with no red flag concerns noted. Do not feel rushing to obtain x-rays as needed today, and she notes she is not symptomatic today.  Her symptoms have been intermittent. Recommended the following: Recommend a contrast therapy approach with warm compresses to the area, followed by range of motion exercises, followed by cold has discomfort  Remaining active very important, and she noted it often does feel better with and after walking.  Also doing some  stretching exercises fairly routinely recommended and a few to do noted in the office today Also can use an extra strength or arthritis strength Tylenol product as needed for discomfort.  Should follow-up if symptoms recurring and more problematic over time, and may need imaging at that point and further management recommendations pending reassessment       Towanda Malkin, MD 01/06/20 11:03 AM

## 2020-01-06 NOTE — Patient Instructions (Signed)
Recommend a contrast therapy approach with warm compresses to the area, followed by range of motion exercises, followed by cold discomfort present  Remaining active very important  Also can use an extra strength for arthritis strength Tylenol product as needed for discomfort.   Acute Back Pain, Adult Acute back pain is sudden and usually short-lived. It is often caused by an injury to the muscles and tissues in the back. The injury may result from:  A muscle or ligament getting overstretched or torn (strained). Ligaments are tissues that connect bones to each other. Lifting something improperly can cause a back strain.  Wear and tear (degeneration) of the spinal disks. Spinal disks are circular tissue that provides cushioning between the bones of the spine (vertebrae).  Twisting motions, such as while playing sports or doing yard work.  A hit to the back.  Arthritis. You may have a physical exam, lab tests, and imaging tests to find the cause of your pain. Acute back pain usually goes away with rest and home care. Follow these instructions at home: Managing pain, stiffness, and swelling  Take over-the-counter and prescription medicines only as told by your health care provider.  Your health care provider may recommend applying ice during the first 24-48 hours after your pain starts. To do this: ? Put ice in a plastic bag. ? Place a towel between your skin and the bag. ? Leave the ice on for 20 minutes, 2-3 times a day.  If directed, apply heat to the affected area as often as told by your health care provider. Use the heat source that your health care provider recommends, such as a moist heat pack or a heating pad. ? Place a towel between your skin and the heat source. ? Leave the heat on for 20-30 minutes. ? Remove the heat if your skin turns bright red. This is especially important if you are unable to feel pain, heat, or cold. You have a greater risk of getting  burned. Activity   Do not stay in bed. Staying in bed for more than 1-2 days can delay your recovery.  Sit up and stand up straight. Avoid leaning forward when you sit, or hunching over when you stand. ? If you work at a desk, sit close to it so you do not need to lean over. Keep your chin tucked in. Keep your neck drawn back, and keep your elbows bent at a right angle. Your arms should look like the letter "L." ? Sit high and close to the steering wheel when you drive. Add lower back (lumbar) support to your car seat, if needed.  Take short walks on even surfaces as soon as you are able. Try to increase the length of time you walk each day.  Do not sit, drive, or stand in one place for more than 30 minutes at a time. Sitting or standing for long periods of time can put stress on your back.  Do not drive or use heavy machinery while taking prescription pain medicine.  Use proper lifting techniques. When you bend and lift, use positions that put less stress on your back: ? [place] your knees. ? Keep the load close to your body. ? Avoid twisting.  Exercise regularly as told by your health care provider. Exercising helps your back heal faster and helps prevent back injuries by keeping muscles strong and flexible.  Work with a physical therapist to make a safe exercise program, as recommended by your health care provider. Do any  exercises as told by your physical therapist. Lifestyle  Maintain a healthy weight. Extra weight puts stress on your back and makes it difficult to have good posture.  Avoid activities or situations that make you feel anxious or stressed. Stress and anxiety increase muscle tension and can make back pain worse. Learn ways to manage anxiety and stress, such as through exercise. General instructions  Sleep on a firm mattress in a comfortable position. Try lying on your side with your knees slightly bent. If you lie on your back, put a pillow under your knees.  Follow  your treatment plan as told by your health care provider. This may include: ? Cognitive or behavioral therapy. ? Acupuncture or massage therapy. ? Meditation or yoga. Contact a health care provider if:  You have pain that is not relieved with rest or medicine.  You have increasing pain going down into your legs or buttocks.  Your pain does not improve after 2 weeks.  You have pain at night.  You lose weight without trying.  You have a fever or chills. Get help right away if:  You develop new bowel or bladder control problems.  You have unusual weakness or numbness in your arms or legs.  You develop nausea or vomiting.  You develop abdominal pain.  You feel faint. Summary  Acute back pain is sudden and usually short-lived.  Use proper lifting techniques. When you bend and lift, use positions that put less stress on your back.  Take over-the-counter and prescription medicines and apply heat or ice as directed by your health care provider. This information is not intended to replace advice given to you by your health care provider. Make sure you discuss any questions you have with your health care provider. Document Revised: 04/29/2018 Document Reviewed: 08/22/2016 Elsevier Patient Education  [place].

## 2020-01-07 ENCOUNTER — Other Ambulatory Visit: Payer: Self-pay | Admitting: Family Medicine

## 2020-01-21 ENCOUNTER — Other Ambulatory Visit: Payer: Self-pay | Admitting: Family Medicine

## 2020-02-22 ENCOUNTER — Telehealth: Payer: Self-pay | Admitting: Family Medicine

## 2020-02-22 NOTE — Telephone Encounter (Signed)
Copied from [place] [phone] [phone]. Topic: Medicare AWV >> Feb 22, 2020 10:49 AM Cher Nakai R wrote: Reason for CRM:  1/31 LM to notify AWVS on Feb 23, 2020 will need to be done by telephone or Virtual

## 2020-02-23 ENCOUNTER — Encounter: Payer: Self-pay | Admitting: Family Medicine

## 2020-02-23 ENCOUNTER — Other Ambulatory Visit: Payer: Self-pay

## 2020-02-23 ENCOUNTER — Ambulatory Visit (INDEPENDENT_AMBULATORY_CARE_PROVIDER_SITE_OTHER): Payer: Medicare HMO

## 2020-02-23 ENCOUNTER — Ambulatory Visit (INDEPENDENT_AMBULATORY_CARE_PROVIDER_SITE_OTHER): Payer: Medicare HMO | Admitting: Family Medicine

## 2020-02-23 VITALS — BP 120/80 | HR 73 | Temp 98.2°F | Resp 16 | Ht 63.0 in | Wt 186.2 lb

## 2020-02-23 DIAGNOSIS — R196 Halitosis: Secondary | ICD-10-CM | POA: Diagnosis not present

## 2020-02-23 DIAGNOSIS — Z6832 Body mass index (BMI) 32.0-32.9, adult: Secondary | ICD-10-CM | POA: Diagnosis not present

## 2020-02-23 DIAGNOSIS — Z5181 Encounter for therapeutic drug level monitoring: Secondary | ICD-10-CM

## 2020-02-23 DIAGNOSIS — Z780 Asymptomatic menopausal state: Secondary | ICD-10-CM

## 2020-02-23 DIAGNOSIS — Z0000 Encounter for general adult medical examination without abnormal findings: Secondary | ICD-10-CM

## 2020-02-23 DIAGNOSIS — D581 Hereditary elliptocytosis: Secondary | ICD-10-CM

## 2020-02-23 DIAGNOSIS — E785 Hyperlipidemia, unspecified: Secondary | ICD-10-CM

## 2020-02-23 DIAGNOSIS — E6609 Other obesity due to excess calories: Secondary | ICD-10-CM

## 2020-02-23 DIAGNOSIS — M19049 Primary osteoarthritis, unspecified hand: Secondary | ICD-10-CM | POA: Diagnosis not present

## 2020-02-23 LAB — CBC WITH DIFFERENTIAL/PLATELET
Absolute Monocytes: 382 cells/uL (ref 200–950)
Basophils Absolute: 29 cells/uL (ref 0–200)
Basophils Relative: 0.6 %
Eosinophils Absolute: 69 cells/uL (ref 15–500)
Eosinophils Relative: 1.4 %
HCT: 40.9 % (ref 35.0–45.0)
Hemoglobin: 12.9 g/dL (ref 11.7–15.5)
Lymphs Abs: 1661 cells/uL (ref 850–3900)
MCH: 27.3 pg (ref 27.0–33.0)
MCHC: 31.5 g/dL — ABNORMAL LOW (ref 32.0–36.0)
MCV: 86.5 fL (ref 80.0–100.0)
MPV: 13.5 fL — ABNORMAL HIGH (ref 7.5–12.5)
Monocytes Relative: 7.8 %
Neutro Abs: 2759 cells/uL (ref 1500–7800)
Neutrophils Relative %: 56.3 %
Platelets: 182 10*3/uL (ref 140–400)
RBC: 4.73 10*6/uL (ref 3.80–5.10)
RDW: 12.1 % (ref 11.0–15.0)
Total Lymphocyte: 33.9 %
WBC: 4.9 10*3/uL (ref 3.8–10.8)

## 2020-02-23 LAB — COMPLETE METABOLIC PANEL WITH GFR
AG Ratio: 1.7 (calc) (ref 1.0–2.5)
ALT: 12 U/L (ref 6–29)
AST: 17 U/L (ref 10–35)
Albumin: 4.3 g/dL (ref 3.6–5.1)
Alkaline phosphatase (APISO): 61 U/L (ref 37–153)
BUN: 16 mg/dL (ref 7–25)
CO2: 30 mmol/L (ref 20–32)
Calcium: 9.7 mg/dL (ref 8.6–10.4)
Chloride: 106 mmol/L (ref 98–110)
Creat: 0.83 mg/dL (ref 0.50–0.99)
GFR, Est African American: 84 mL/min/{1.73_m2} (ref >=60)
GFR, Est Non African American: 72 mL/min/{1.73_m2} (ref >=60)
Globulin: 2.6 g/dL (calc) (ref 1.9–3.7)
Glucose, Bld: 85 mg/dL (ref 65–99)
Potassium: 4.4 mmol/L (ref 3.5–5.3)
Sodium: 142 mmol/L (ref 135–146)
Total Bilirubin: 0.5 mg/dL (ref 0.2–1.2)
Total Protein: 6.9 g/dL (ref 6.1–8.1)

## 2020-02-23 LAB — LIPID PANEL
Cholesterol: 136 mg/dL
HDL: 62 mg/dL
LDL Cholesterol (Calc): 61 mg/dL
Non-HDL Cholesterol (Calc): 74 mg/dL
Total CHOL/HDL Ratio: 2.2 (calc)
Triglycerides: 58 mg/dL

## 2020-02-23 MED ORDER — PRAVASTATIN SODIUM 20 MG PO TABS: 20.0000 mg | ORAL_TABLET | Freq: Every day | ORAL | 3 refills | Status: DC

## 2020-02-23 MED ORDER — PANTOPRAZOLE SODIUM 20 MG PO TBEC: 20.0000 mg | DELAYED_RELEASE_TABLET | Freq: Every day | ORAL | 3 refills | Status: DC

## 2020-02-23 NOTE — Patient Instructions (Addendum)
There are risks to long term use of PPI (protonix)-  including bone loss, C. Diff diarrhea, pneumonia, infections, CKD, electrolyte abnormalities.   If you are still using for breath or for any acid reflux, you can always try to wean off the medications by using pepcid 20 mg up to two times a day.  Continue your pravastatin and healthy diet and exercis  Health Maintenance  Topic Date Due  . DEXA scan (bone density measurement)  01/30/2014  . COVID-19 Vaccine (3 - Booster for Moderna series) 10/19/2019  . Mammogram  03/22/2020  . Tetanus Vaccine  10/04/2020  . Colon Cancer Screening  03/22/2023  . Flu Shot  Completed  .  Hepatitis C: One time screening is recommended by Center for Disease Control  (CDC) for  adults born from [birth year] through 1964.   Completed  . Pneumonia vaccines  Completed      High Cholesterol  High cholesterol is a condition in which the blood has high levels of a white, waxy substance similar to fat (cholesterol). The liver makes all the cholesterol that the body needs. The human body needs small amounts of cholesterol to help build cells. A person gets extra or excess cholesterol from the food that he or she eats. The blood carries cholesterol from the liver to the rest of the body. If you have high cholesterol, deposits (plaques) may build up on the walls of your arteries. Arteries are the blood vessels that carry blood away from your heart. These plaques make the arteries narrow and stiff. Cholesterol plaques increase your risk for heart attack and stroke. Work with your health care provider to keep your cholesterol levels in a healthy range. What increases the risk? The following factors may make you more likely to develop this condition:  Eating foods that are high in animal fat (saturated fat) or cholesterol.  Being overweight.  Not getting enough exercise.  A family history of high cholesterol (familial hypercholesterolemia).  Use of tobacco  products.  Having diabetes. What are the signs or symptoms? There are no symptoms of this condition. How is this diagnosed? This condition may be diagnosed based on the results of a blood test.  If you are older than 69 years of age, your health care provider may check your cholesterol levels every 4-6 years.  You may be checked more often if you have high cholesterol or other risk factors for heart disease. The blood test for cholesterol measures:  "Bad" cholesterol, or LDL cholesterol. This is the main type of cholesterol that causes heart disease. The desired level is less than 100 mg/dL.  "Good" cholesterol, or HDL cholesterol. HDL helps protect against heart disease by cleaning the arteries and carrying the LDL to the liver for processing. The desired level for HDL is 60 mg/dL or higher.  Triglycerides. These are fats that your body can store or burn for energy. The desired level is less than 150 mg/dL.  Total cholesterol. This measures the total amount of cholesterol in your blood and includes LDL, HDL, and triglycerides. The desired level is less than 200 mg/dL. How is this treated? This condition may be treated with:  Diet changes. You may be asked to eat foods that have more fiber and less saturated fats or added sugar.  Lifestyle changes. These may include regular exercise, maintaining a healthy weight, and quitting use of tobacco products.  Medicines. These are given when diet and lifestyle changes have not worked. You may be prescribed a statin medicine  to help lower your cholesterol levels. Follow these instructions at home: Eating and drinking  Eat a healthy, balanced diet. This diet includes: ? Daily servings of a variety of fresh, frozen, or canned fruits and vegetables. ? Daily servings of whole grain foods that are rich in fiber. ? Foods that are low in saturated fats and trans fats. These include poultry and fish without skin, lean cuts of meat, and low-fat dairy  products. ? A variety of fish, especially oily fish that contain omega-3 fatty acids. Aim to eat fish at least 2 times a week.  Avoid foods and drinks that have added sugar.  Use healthy cooking methods, such as roasting, grilling, broiling, baking, poaching, steaming, and stir-frying. Do not fry your food except for stir-frying.   Lifestyle  Get regular exercise. Aim to exercise for a total of 150 minutes a week. Increase your activity level by doing activities such as gardening, walking, and taking the stairs.  Do not use any products that contain nicotine or tobacco, such as cigarettes, e-cigarettes, and chewing tobacco. If you need help quitting, ask your health care provider.   General instructions  Take over-the-counter and prescription medicines only as told by your health care provider.  Keep all follow-up visits as told by your health care provider. This is important. Where to find more information  American Heart Association: www.heart.org  National Heart, Lung, and Blood Institute: https://wilson-eaton.com/ Contact a health care provider if:  You have trouble achieving or maintaining a healthy diet or weight.  You are starting an exercise program.  You are unable to stop smoking. Get help right away if:  You have chest pain.  You have trouble breathing.  You have any symptoms of a stroke. "BE FAST" is an easy way to remember the main warning signs of a stroke: ? B - Balance. Signs are dizziness, sudden trouble walking, or loss of balance. ? E - Eyes. Signs are trouble seeing or a sudden change in vision. ? F - Face. Signs are sudden weakness or numbness of the face, or the face or eyelid drooping on one side. ? A - Arms. Signs are weakness or numbness in an arm. This happens suddenly and usually on one side of the body. ? S - Speech. Signs are sudden trouble speaking, slurred speech, or trouble understanding what people say. ? T - Time. Time to call emergency services.  Write down what time symptoms started.  You have other signs of a stroke, such as: ? A sudden, severe headache with no known cause. ? Nausea or vomiting. ? Seizure. These symptoms may represent a serious problem that is an emergency. Do not wait to see if the symptoms will go away. Get medical help right away. Call your local emergency services (911 in the [place] Do not drive yourself to the hospital. Summary  Cholesterol plaques increase your risk for heart attack and stroke. Work with your health care provider to keep your cholesterol levels in a healthy range.  Eat a healthy, balanced diet, get regular exercise, and maintain a healthy weight.  Do not use any products that contain nicotine or tobacco, such as cigarettes, e-cigarettes, and chewing tobacco.  Get help right away if you have any symptoms of a stroke. This information is not intended to replace advice given to you by your health care provider. Make sure you discuss any questions you have with your health care provider. Document Revised: 12/08/2018 Document Reviewed: 12/08/2018 Elsevier Patient Education  2021 Elsevier  Inc.

## 2020-02-23 NOTE — Progress Notes (Signed)
Name: Desiree Carpenter   MRN: [id number]    DOB: 1950-02-03   Date:02/23/2020       Progress Note  Chief Complaint  Patient presents with  . Follow-up  . Hypertension  . Gastroesophageal Reflux     Subjective:   Desiree Carpenter is a 70 y.o. female, presents to clinic for routine f/up  Hyperlipidemia: Currently treated with pravastatin 20 mg, pt reports good med compliance Last Lipids: Lab Results  Component Value Date   CHOL 152 03/10/2019   HDL 65 03/10/2019   LDLCALC 71 03/10/2019   TRIG 79 03/10/2019   CHOLHDL 2.3 03/10/2019   - Denies: Chest pain, shortness of breath, myalgias, claudication She works on overall healthy diet, avoid fried foods  Halitosis - she tried PPI for GERD/reflux and this was very effective at treating her breath, she has not f/up in a year regarding this but has requested refills.  No abd pain, indigestion, change in bowel movements, melena or hematochezia.  Counseled and discussed long-term PPI side effects  She did her Medicare well visit today over the phone  She is due for her mammogram in September 2022 (put maintenance tab updated from 86-month follow-up mammogram to 1 year follow-up mammogram after reviewing her most recent mammogram results)  and DEXA has not been done since about 2014 we discussed rechecking this especially with PPI use Reviewed her last DEXA which was normal  She has history of hereditary elliptocytosis -her last CBC with differential was unremarkable except for mildly elevated MPV  She is fasting today for her blood work     Current Outpatient Medications:  .  Ascorbic Acid (VITAMIN C PO), Take by mouth daily., Disp: , Rfl:  .  aspirin EC 81 MG tablet, Take 1 tablet (81 mg total) daily by mouth., Disp: , Rfl:  .  Multiple Vitamin (MULTIVITAMIN) tablet, Take 1 tablet by mouth daily., Disp: , Rfl:  .  pantoprazole (PROTONIX) 20 MG tablet, TAKE 1 TABLET BY MOUTH EVERY DAY, Disp: 30 tablet, Rfl: 1 .  pravastatin  (PRAVACHOL) 20 MG tablet, Take 1 tablet (20 mg total) by mouth daily. At bedtime, Disp: 90 tablet, Rfl: 3  Patient Active Problem List   Diagnosis Date Noted  . 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 Arthritis of both knees 02/27/2018  . Mild hyperlipidemia 02/27/2018  . Family hx-breast malignancy 03/28/2016  . Obesity 03/28/2016    Past Surgical History:  Procedure Laterality Date  . ABDOMINAL HYSTERECTOMY    . BUNIONECTOMY    . COLONOSCOPY WITH PROPOFOL N/A 02/28/2017   Procedure: COLONOSCOPY WITH PROPOFOL;  Surgeon: Jonathon Bellows, MD;  Location: [facility] ENDOSCOPY;  Service: Gastroenterology;  Laterality: N/A;  . COLONOSCOPY WITH PROPOFOL N/A 03/21/2018   Procedure: COLONOSCOPY WITH BIOPSY;  Surgeon: Lucilla Lame, MD;  Location: [place];  Service: Endoscopy;  Laterality: N/A;    Family History  Problem Relation Age of Onset  . Cancer Mother        breast (29), colon (41)  . Breast cancer Mother 42  . Cancer Father 48       colon  . Cancer Maternal Grandmother        breast  . Breast cancer Maternal Grandmother 89  . Heart disease Maternal Grandfather        heart failure  . Cancer Maternal Grandfather        lung  . Stroke Paternal Grandmother     Social History   Tobacco Use  .  Smoking status: Never Smoker  . Smokeless tobacco: Never Used  Vaping Use  . Vaping Use: Never used  Substance Use Topics  . Alcohol use: No    Alcohol/week: 0.0 standard drinks  . Drug use: No     Allergies  Allergen Reactions  . Atorvastatin     Constipation    Health Maintenance  Topic Date Due  . COVID-19 Vaccine (3 - Booster for Moderna series) 10/19/2019  . MAMMOGRAM  03/22/2020  . TETANUS/TDAP  10/04/2020  . COLONOSCOPY (Pts 45-76yrs Insurance coverage will need to be confirmed)  03/22/2023  . INFLUENZA VACCINE  Completed  . DEXA SCAN  Completed  . Hepatitis C Screening  Completed  . PNA vac Low Risk Adult  Completed    Chart Review Today: I personally  reviewed active problem list, medication list, allergies, family history, social history, health maintenance, notes from last encounter, lab results, imaging with the patient/caregiver today.   Review of Systems  Constitutional: Negative.   HENT: Negative.   Eyes: Negative.   Respiratory: Negative.   Cardiovascular: Negative.   Gastrointestinal: Negative.   Endocrine: Negative.   Genitourinary: Negative.   Musculoskeletal: Negative.   Skin: Negative.   Allergic/Immunologic: Negative.   Neurological: Negative.   Hematological: Negative.   Psychiatric/Behavioral: Negative.   All other systems reviewed and are negative.    Objective:   Vitals:   02/23/20 1130  BP: 120/80  Pulse: 73  Resp: 16  Temp: 98.2 F (36.8 C)  TempSrc: Oral  SpO2: 98%  Weight: 186 lb 3.2 oz (84.5 kg)  Height: 5\' 3"  (1.6 m)    Body mass index is 32.98 kg/m.  Physical Exam Vitals and nursing note reviewed.  Constitutional:      General: She is not in acute distress.    Appearance: Normal appearance. She is well-developed. She is obese. She is not ill-appearing, toxic-appearing or diaphoretic.     Interventions: Face mask in place.  HENT:     Head: Normocephalic and atraumatic.     Right Ear: External ear normal.     Left Ear: External ear normal.  Eyes:     General: Lids are normal. No scleral icterus.       Right eye: No discharge.        Left eye: No discharge.     Conjunctiva/sclera: Conjunctivae normal.  Neck:     Trachea: Phonation normal. No tracheal deviation.  Cardiovascular:     Rate and Rhythm: Normal rate and regular rhythm.     Pulses: Normal pulses.          Radial pulses are 2+ on the right side and 2+ on the left side.       Posterior tibial pulses are 2+ on the right side and 2+ on the left side.     Heart sounds: Normal heart sounds. No murmur heard. No friction rub. No gallop.   Pulmonary:     Effort: Pulmonary effort is normal. No respiratory distress.     Breath  sounds: Normal breath sounds. No stridor. No wheezing, rhonchi or rales.  Chest:     Chest wall: No tenderness.  Abdominal:     General: Bowel sounds are normal. There is no distension.     Palpations: Abdomen is soft.     Tenderness: There is no abdominal tenderness.  Musculoskeletal:     Right lower leg: No edema.     Left lower leg: No edema.  Skin:    General: Skin  is warm and dry.     Coloration: Skin is not jaundiced or pale.     Findings: No lesion or rash.  Neurological:     Mental Status: She is alert. Mental status is at baseline.     Motor: No abnormal muscle tone.     Gait: Gait normal.  Psychiatric:        Mood and Affect: Mood normal.        Speech: Speech normal.        Behavior: Behavior normal.     Health Maintenance  Topic Date Due  . DEXA SCAN  01/30/2014  . COVID-19 Vaccine (3 - Booster for Moderna series) 10/19/2019  . MAMMOGRAM  09/22/2020  . TETANUS/TDAP  10/04/2020  . COLONOSCOPY (Pts 45-17yrs Insurance coverage will need to be confirmed)  03/22/2023  . INFLUENZA VACCINE  Completed  . Hepatitis C Screening  Completed  . PNA vac Low Risk Adult  Completed        Assessment & Plan:     ICD-10-CM   1. Mild hyperlipidemia  99991111 COMPLETE METABOLIC PANEL WITH GFR    Lipid panel   Good med compliance, no myalgias side effects or concerns, she continues to work on healthy diet and staying active  2. 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 Medication monitoring encounter  Z51.81 CBC with Differential/Platelet    COMPLETE METABOLIC PANEL WITH GFR    Lipid panel  6. Postmenopausal estrogen deficiency  Z78.0    Discussed doing  follow-up bone density  7. Class 1 obesity due to excess calories with serious comorbidity and body mass index (BMI) of 32.0 to 32.9 in adult  E66.09    Z68.32    With associated comorbidities of hyperlipidemia, bilateral knee arthritis     Return in about 1 year (around 02/22/2021) for sooner if needed.   Delsa Grana, PA-C 02/23/20 11:47 AM

## 2020-02-23 NOTE — Progress Notes (Signed)
Subjective:   Desiree Carpenter is a 70 y.o. female who presents for Medicare Annual (Subsequent) preventive examination.  Virtual Visit via Telephone Note  I connected with  Desiree Carpenter on 02/23/20 at 10:40 AM EST by telephone and verified that I am speaking with the correct person using two identifiers.  Location: Patient: home Provider: [place] Persons participating in the virtual visit: [place]   I discussed the limitations, risks, security and privacy concerns of performing an evaluation and management service by telephone and the availability of in person appointments. The patient expressed understanding and agreed to proceed.  Interactive audio and video telecommunications were attempted between this nurse and patient, however failed, due to patient having technical difficulties OR patient did not have access to video capability.  We continued and completed visit with audio only.  Some vital signs may be absent or patient reported.   Clemetine Marker, LPN    Review of Systems     Cardiac Risk Factors include: advanced age (>53men, >19 women);dyslipidemia;obesity (BMI >30kg/m2)     Objective:    There were no vitals filed for this visit. There is no height or weight on file to calculate BMI.  Advanced Directives 02/23/2020 02/19/2019 10/02/2018 03/31/2018 03/21/2018 03/17/2018 02/14/2018  Does Patient Have a Medical Advance Directive? No No No No No No Yes  Does patient want to make changes to medical advance directive? - No - Patient declined - - - - Yes (MAU/Ambulatory/Procedural Areas - Information given)  Would patient like information on creating a medical advance directive? Yes (MAU/Ambulatory/Procedural Areas - Information given) - No - Patient declined No - Patient declined No - Patient declined No - Patient declined -    Current Medications (verified) Outpatient Encounter Medications as of 02/23/2020  Medication Sig  . Ascorbic Acid (VITAMIN C PO)  Take by mouth daily.  Marland Kitchen aspirin EC 81 MG tablet Take 1 tablet (81 mg total) daily by mouth.  . Multiple Vitamin (MULTIVITAMIN) tablet Take 1 tablet by mouth daily.  . pantoprazole (PROTONIX) 20 MG tablet TAKE 1 TABLET BY MOUTH EVERY DAY  . pravastatin (PRAVACHOL) 20 MG tablet Take 1 tablet (20 mg total) by mouth daily. At bedtime  . [DISCONTINUED] Pyridoxine HCl (VITAMIN B-6 PO) Take by mouth daily.   No facility-administered encounter medications on file as of 02/23/2020.    Allergies (verified) Atorvastatin   History: Past Medical History:  Diagnosis Date  . Arthritis of both knees 02/27/2018  . Encounter for screening for HIV   . 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 Mild hyperlipidemia 02/27/2018  . Obesity 03/28/2016   Past Surgical History:  Procedure Laterality Date  . ABDOMINAL HYSTERECTOMY    . BUNIONECTOMY    . COLONOSCOPY WITH PROPOFOL N/A 02/28/2017   Procedure: COLONOSCOPY WITH PROPOFOL;  Surgeon: Jonathon Bellows, MD;  [facility] ENDOSCOPY;  Service: Gastroenterology;  Laterality: N/A;  . COLONOSCOPY WITH PROPOFOL N/A 03/21/2018   Procedure: COLONOSCOPY WITH BIOPSY;  Surgeon: Lucilla Lame, MD;  Location: [place];  Service: Endoscopy;  Laterality: N/A;   Family History  Problem Relation Age of Onset  . Cancer Mother        breast (74), colon (61)  . Breast cancer Mother 29  . Cancer Father 68       colon  . Cancer Maternal Grandmother        breast  . Breast cancer Maternal Grandmother 45  . Heart disease Maternal Grandfather  heart failure  . Cancer Maternal Grandfather        lung  . Stroke Paternal Grandmother    Social History   Socioeconomic History  . Marital status: Married    Spouse name: Not on file  . Number of children: 1  . Years of education: Not on file  . Highest education level: Bachelor's degree (e.g., BA, AB, BS)  Occupational History  . Occupation: retired  Tobacco Use  . Smoking status: Never Smoker  . Smokeless  tobacco: Never Used  Vaping Use  . Vaping Use: Never used  Substance and Sexual Activity  . Alcohol use: No    Alcohol/week: 0.0 standard drinks  . Drug use: No  . Sexual activity: Not Currently  Other Topics Concern  . Not on file  Social History Narrative   Pt has one child who lives in [place] and her mother recently came to live with her   Social Determinants of Health   Financial Resource Strain: Low Risk   . Difficulty of Paying Living Expenses: Not hard at all  Food Insecurity: No Food Insecurity  . Worried About Charity fundraiser in the Last Year: Never true  . Ran Out of Food in the Last Year: Never true  Transportation Needs: No Transportation Needs  . Lack of Transportation (Medical): No  . Lack of Transportation (Non-Medical): No  Physical Activity: Insufficiently Active  . Days of Exercise per Week: 3 days  . Minutes of Exercise per Session: 30 min  Stress: No Stress Concern Present  . Feeling of Stress : Not at all  Social Connections: Moderately Integrated  . Frequency of Communication with Friends and Family: More than three times a week  . Frequency of Social Gatherings with Friends and Family: More than three times a week  . Attends Religious Services: More than 4 times per year  . Active Member of Clubs or Organizations: No  . Attends Archivist Meetings: Never  . Marital Status: Married    Tobacco Counseling Counseling given: Not Answered   Clinical Intake:  Pre-visit preparation completed: Yes  Pain : No/denies pain     Nutritional Risks: None Diabetes: No  How often do you need to have someone help you when you read instructions, pamphlets, or other written materials from your doctor or pharmacy?: 1 - Never    Interpreter Needed?: No  Information entered by :: Clemetine Marker LPN   Activities of Daily Living In your present state of health, do you have any difficulty performing the following activities: 02/23/2020 01/06/2020   Hearing? N N  Comment declines hearing aids -  Vision? N N  Difficulty concentrating or making decisions? N N  Walking or climbing stairs? N N  Dressing or bathing? N N  Doing errands, shopping? N N  Preparing Food and eating ? N -  Using the Toilet? N -  In the past six months, have you accidently leaked urine? N -  Do you have problems with loss of bowel control? N -  Managing your Medications? N -  Managing your Finances? N -  Housekeeping or managing your Housekeeping? N -  Some recent data might be hidden    Patient Care Team: Delsa Grana, PA-C as PCP - General (Family Medicine) Anell Barr, OD (Optometry)  Indicate any recent Medical Services you may have received from other than [facility] providers in the past year (date may be approximate).     Assessment:   This is  a routine wellness examination for [facility].  Hearing/Vision screen  Hearing Screening   125Hz  250Hz  500Hz  1000Hz  2000Hz  3000Hz  4000Hz  6000Hz  8000Hz   Right ear:           Left ear:           Comments: Pt denies hearing difficulty   Vision Screening Comments: Vision screenings by Dr. Ellin Mayhew  Dietary issues and exercise activities discussed: Current Exercise Habits: Home exercise routine, Type of exercise: walking, Time (Minutes): 30, Frequency (Times/Week): 3, Weekly Exercise (Minutes/Week): 90, Intensity: Moderate, Exercise limited by: None identified  Goals    . Increase physical activity     Pt would like to increase physical activity to 150 minutes per week    . Weight (lb) < 168 lb (76.2 kg)      Depression Screen PHQ 2/9 Scores 02/23/2020 01/06/2020 03/10/2019 02/19/2019 09/01/2018 02/27/2018 02/14/2018  PHQ - 2 Score 0 0 0 0 0 0 0  PHQ- 9 Score - - 0 - 0 0 -    Fall Risk Fall Risk  02/23/2020 01/06/2020 03/10/2019 02/19/2019 09/01/2018  Falls in the past year? 0 0 0 0 0  Number falls in past yr: 0 0 0 0 0  Injury with Fall? 0 0 0 0 0  Risk for fall due to : No Fall Risks - - No Fall Risks -   Follow up Falls prevention discussed - - Falls prevention discussed -    FALL RISK PREVENTION PERTAINING TO THE HOME:  Any stairs in or around the home? Yes  If so, are there any without handrails? No  Home free of loose throw rugs in walkways, pet beds, electrical cords, etc? Yes  Adequate lighting in your home to reduce risk of falls? Yes   ASSISTIVE DEVICES UTILIZED TO PREVENT FALLS:  Life alert? No  Use of a cane, walker or w/c? No  Grab bars in the bathroom? Yes  Shower chair or bench in shower? No  Elevated toilet seat or a handicapped toilet? No   TIMED UP AND GO:  Was the test performed? No .   Cognitive Function: Normal cognitive status assessed by direct observation by this Nurse Health Advisor. No abnormalities found.       6CIT Screen 02/19/2019 02/14/2018  What Year? 0 points 0 points  What month? 0 points 0 points  What time? 0 points 0 points  Count back from 20 0 points 0 points  Months in reverse 0 points 0 points  Repeat phrase 0 points 0 points  Total Score 0 0    Immunizations Immunization History  Administered Date(s) Administered  . Fluad Quad(high Dose 65+) 10/27/2018  . Influenza, High Dose Seasonal PF 10/11/2017, 11/18/2019  . Influenza,inj,Quad PF,6+ Mos 10/19/2014  . Influenza-Unspecified 10/11/2017  . Moderna Sars-Covid-2 Vaccination 03/21/2019, 04/18/2019  . Pneumococcal Conjugate-13 05/06/2017  . Pneumococcal Polysaccharide-23 09/01/2018  . Tdap 10/05/2010    TDAP status: Up to date  Flu Vaccine status: Up to date  Pneumococcal vaccine status: Up to date  Covid-19 vaccine status: Completed vaccines  Qualifies for Shingles Vaccine? Yes   Zostavax completed No   Shingrix Completed?: No.    Education has been provided regarding the importance of this vaccine. Patient has been advised to call insurance company to determine out of pocket expense if they have not yet received this vaccine. Advised may also receive vaccine at local  pharmacy or Health Dept. Verbalized acceptance and understanding.  Screening Tests Health Maintenance  Topic Date Due  .  COVID-19 Vaccine (3 - Booster for Moderna series) 10/19/2019  . MAMMOGRAM  03/22/2020  . TETANUS/TDAP  10/04/2020  . COLONOSCOPY (Pts 45-66yrs Insurance coverage will need to be confirmed)  03/22/2023  . INFLUENZA VACCINE  Completed  . DEXA SCAN  Completed  . Hepatitis C Screening  Completed  . PNA vac Low Risk Adult  Completed    Health Maintenance  Health Maintenance Due  Topic Date Due  . COVID-19 Vaccine (3 - Booster for Moderna series) 10/19/2019    Colorectal cancer screening: Type of screening: Colonoscopy. Completed 03/21/18. Repeat every 5 years  Mammogram status: Completed 09/23/19. Repeat every year  Bone Density status: Completed 01/31/12. Results reflect: Bone density results: NORMAL. Repeat every 2 years.  Ordered today.   Lung Cancer Screening: (Low Dose CT Chest recommended if Age 72-80 years, 30 pack-year currently smoking OR have quit w/in 15years.) does not qualify.   Additional Screening:  Hepatitis C Screening: does qualify; Completed 03/28/16  Vision Screening: Recommended annual ophthalmology exams for early detection of glaucoma and other disorders of the eye. Is the patient up to date with their annual eye exam?  Yes  Who is the provider or what is the name of the office in which the patient attends annual eye exams? Dr. Ellin Mayhew  Dental Screening: Recommended annual dental exams for proper oral hygiene  Community Resource Referral / Chronic Care Management: CRR required this visit?  No   CCM required this visit?  No      Plan:     I have personally reviewed and noted the following in the patient's chart:   . Medical and social history . Use of alcohol, tobacco or illicit drugs  . Current medications and supplements . Functional ability and status . Nutritional status . Physical activity . Advanced directives . List of  other physicians . Hospitalizations, surgeries, and ER visits in previous 12 months . Vitals . Screenings to include cognitive, depression, and falls . Referrals and appointments  In addition, I have reviewed and discussed with patient certain preventive protocols, quality metrics, and best practice recommendations. A written personalized care plan for preventive services as well as general preventive health recommendations were provided to patient.     Clemetine Marker, LPN   01/27/1094   Nurse Notes: pt doing well. Scheduled for routine follow up with Delsa Grana [facility] today.

## 2020-02-23 NOTE — Patient Instructions (Signed)
Ms. Devita , Thank you for taking time to come for your Medicare Wellness Visit. I appreciate your ongoing commitment to your health goals. Please review the following plan we discussed and let me know if I can assist you in the future.   Screening recommendations/referrals: Colonoscopy: done 03/21/18. Repeat in 2025.  Mammogram: done 09/23/19 Bone Density: done 01/31/12. Please call [phone] to schedule your bone density screening.  Recommended yearly ophthalmology/optometry visit for glaucoma screening and checkup Recommended yearly dental visit for hygiene and checkup  Vaccinations: Influenza vaccine: done 11/18/19 Pneumococcal vaccine: done 09/01/18 Tdap vaccine: done 10/05/10 Shingles vaccine: Shingrix discussed. Please contact your pharmacy for coverage information.  Covid-19: done 03/21/19 & 04/18/19  Advanced directives: Advance directive discussed with you today. I have provided a copy for you to complete at home and have notarized. Once this is complete please bring a copy in to our office so we can scan it into your chart.  Conditions/risks identified: Keep up the great work!  Next appointment: Follow up in one year for your annual wellness visit    Preventive Care 65 Years and Older, Female Preventive care refers to lifestyle choices and visits with your health care provider that can promote health and wellness. What does preventive care include?  A yearly physical exam. This is also called an annual well check.  Dental exams once or twice a year.  Routine eye exams. Ask your health care provider how often you should have your eyes checked.  Personal lifestyle choices, including:  Daily care of your teeth and gums.  Regular physical activity.  Eating a healthy diet.  Avoiding tobacco and drug use.  Limiting alcohol use.  Practicing safe sex.  Taking low-dose aspirin every day.  Taking vitamin and mineral supplements as recommended by your health care  provider. What happens during an annual well check? The services and screenings done by your health care provider during your annual well check will depend on your age, overall health, lifestyle risk factors, and family history of disease. Counseling  Your health care provider may ask you questions about your:  Alcohol use.  Tobacco use.  Drug use.  Emotional well-being.  Home and relationship well-being.  Sexual activity.  Eating habits.  History of falls.  Memory and ability to understand (cognition).  Work and work Statistician.  Reproductive health. Screening  You may have the following tests or measurements:  Height, weight, and BMI.  Blood pressure.  Lipid and cholesterol levels. These may be checked every 5 years, or more frequently if you are over 8 years old.  Skin check.  Lung cancer screening. You may have this screening every year starting at age 51 if you have a 30-pack-year history of smoking and currently smoke or have quit within the past 15 years.  Fecal occult blood test (FOBT) of the stool. You may have this test every year starting at age 74.  Flexible sigmoidoscopy or colonoscopy. You may have a sigmoidoscopy every 5 years or a colonoscopy every 10 years starting at age 12.  Hepatitis C blood test.  Hepatitis B blood test.  Sexually transmitted disease (STD) testing.  Diabetes screening. This is done by checking your blood sugar (glucose) after you have not eaten for a while (fasting). You may have this done every 1-3 years.  Bone density scan. This is done to screen for osteoporosis. You may have this done starting at age 53.  Mammogram. This may be done every 1-2 years. Talk to your health  care provider about how often you should have regular mammograms. Talk with your health care provider about your test results, treatment options, and if necessary, the need for more tests. Vaccines  Your health care provider may recommend certain  vaccines, such as:  Influenza vaccine. This is recommended every year.  Tetanus, diphtheria, and acellular pertussis (Tdap, Td) vaccine. You may need a Td booster every 10 years.  Zoster vaccine. You may need this after age 70.  Pneumococcal 13-valent conjugate (PCV13) vaccine. One dose is recommended after age 51.  Pneumococcal polysaccharide (PPSV23) vaccine. One dose is recommended after age 14. Talk to your health care provider about which screenings and vaccines you need and how often you need them. This information is not intended to replace advice given to you by your health care provider. Make sure you discuss any questions you have with your health care provider. Document Released: 02/04/2015 Document Revised: 09/28/2015 Document Reviewed: 11/09/2014 Elsevier Interactive Patient Education  2017 [place] Prevention in the Home Falls can cause injuries. They can happen to people of all ages. There are many things you can do to make your home safe and to help prevent falls. What can I do on the outside of my home?  Regularly fix the edges of walkways and driveways and fix any cracks.  Remove anything that might make you trip as you walk through a door, such as a raised step or threshold.  Trim any bushes or trees on the path to your home.  Use bright outdoor lighting.  Clear any walking paths of anything that might make someone trip, such as rocks or tools.  Regularly check to see if handrails are loose or broken. Make sure that both sides of any steps have handrails.  Any raised decks and porches should have guardrails on the edges.  Have any leaves, snow, or ice cleared regularly.  Use sand or salt on walking paths during winter.  Clean up any spills in your garage right away. This includes oil or grease spills. What can I do in the bathroom?  Use night lights.  Install grab bars by the toilet and in the tub and shower. Do not use towel bars as grab  bars.  Use non-skid mats or decals in the tub or shower.  If you need to sit down in the shower, use a plastic, non-slip stool.  Keep the floor dry. Clean up any water that spills on the floor as soon as it happens.  Remove soap buildup in the tub or shower regularly.  Attach bath mats securely with double-sided non-slip rug tape.  Do not have throw rugs and other things on the floor that can make you trip. What can I do in the bedroom?  Use night lights.  Make sure that you have a light by your bed that is easy to reach.  Do not use any sheets or blankets that are too big for your bed. They should not hang down onto the floor.  Have a firm chair that has side arms. You can use this for support while you get dressed.  Do not have throw rugs and other things on the floor that can make you trip. What can I do in the kitchen?  Clean up any spills right away.  Avoid walking on wet floors.  Keep items that you use a lot in easy-to-reach places.  If you need to reach something above you, use a strong step stool that has a grab  bar.  Keep electrical cords out of the way.  Do not use floor polish or wax that makes floors slippery. If you must use wax, use non-skid floor wax.  Do not have throw rugs and other things on the floor that can make you trip. What can I do with my stairs?  Do not leave any items on the stairs.  Make sure that there are handrails on both sides of the stairs and use them. Fix handrails that are broken or loose. Make sure that handrails are as long as the stairways.  Check any carpeting to make sure that it is firmly attached to the stairs. Fix any carpet that is loose or worn.  Avoid having throw rugs at the top or bottom of the stairs. If you do have throw rugs, attach them to the floor with carpet tape.  Make sure that you have a light switch at the top of the stairs and the bottom of the stairs. If you do not have them, ask someone to add them for  you. What else can I do to help prevent falls?  Wear shoes that:  Do not have high heels.  Have rubber bottoms.  Are comfortable and fit you well.  Are closed at the toe. Do not wear sandals.  If you use a stepladder:  Make sure that it is fully opened. Do not climb a closed stepladder.  Make sure that both sides of the stepladder are locked into place.  Ask someone to hold it for you, if possible.  Clearly mark and make sure that you can see:  Any grab bars or handrails.  First and last steps.  Where the edge of each step is.  Use tools that help you move around (mobility aids) if they are needed. These include:  Canes.  Walkers.  Scooters.  Crutches.  Turn on the lights when you go into a dark area. Replace any light bulbs as soon as they burn out.  Set up your furniture so you have a clear path. Avoid moving your furniture around.  If any of your floors are uneven, fix them.  If there are any pets around you, be aware of where they are.  Review your medicines with your doctor. Some medicines can make you feel dizzy. This can increase your chance of falling. Ask your doctor what other things that you can do to help prevent falls. This information is not intended to replace advice given to you by your health care provider. Make sure you discuss any questions you have with your health care provider. Document Released: 11/04/2008 Document Revised: 06/16/2015 Document Reviewed: 02/12/2014 Elsevier Interactive Patient Education  2017 [employer].

## 2020-08-15 ENCOUNTER — Telehealth: Payer: Self-pay | Admitting: *Deleted

## 2020-08-15 NOTE — Chronic Care Management (AMB) (Signed)
  Chronic Care Management   Outreach Note  08/15/2020 Name: [name] MRN: [id number] DOB: [birth year]  Desiree Carpenter Parys is a 70 y.o. year old female who is a primary care patient of Delsa Grana, [place]. I reached out to [employer] by phone today in response to a referral sent by [name] [place]'s PCP Delsa Grana, PA-C     An unsuccessful telephone outreach was attempted today. The patient was referred to the case management team for assistance with care management and care coordination.   Follow Up Plan: A HIPAA compliant phone message was left for the patient providing contact information and requesting a return call.  If patient returns call to provider office, please advise to call Embedded Care Management Care Guide Ronell Duffus at [place], [place] [facility]  Direct Dial: [phone]

## 2020-08-25 NOTE — Chronic Care Management (AMB) (Signed)
  Chronic Care Management   Note  08/25/2020 Name: KATTY FRETWELL MRN: [id number] DOB: April 18, 1950  Domenica Fail Gell is a 70 y.o. year old female who is a primary care patient of Delsa Grana, [place]. I reached out to [employer] by phone today in response to a referral sent by Ms. Domenica Fail [place]'s PCP Delsa Grana, PA-C     Ms. Ellegood was given information about Chronic Care Management services today including:  CCM service includes personalized support from designated clinical staff supervised by her physician, including individualized plan of care and coordination with other care providers 24/7 contact phone numbers for assistance for urgent and routine care needs. Service will only be billed when office clinical staff spend 20 minutes or more in a month to coordinate care. Only one practitioner may furnish and bill the service in a calendar month. The patient may stop CCM services at any time (effective at the end of the month) by phone call to the office staff. The patient will be responsible for cost sharing (co-pay) of up to 20% of the service fee (after annual deductible is met).  Patient agreed to services and verbal consent obtained.   Follow up plan: Telephone appointment with care management team member scheduled for: 09/14/2020  Julian Hy, [place] [facility]  Direct Dial: [phone]

## 2020-09-14 ENCOUNTER — Ambulatory Visit (INDEPENDENT_AMBULATORY_CARE_PROVIDER_SITE_OTHER): Payer: Medicare HMO

## 2020-09-14 DIAGNOSIS — E785 Hyperlipidemia, unspecified: Secondary | ICD-10-CM

## 2020-09-14 NOTE — Chronic Care Management (AMB) (Signed)
  Chronic Care Management   CCM RN Visit Note  09/14/2020 Name: Desiree Carpenter MRN: [id number] DOB: Mar 04, 1950  Subjective: Desiree Carpenter is a 70 y.o. year old female who is a primary care patient of Delsa Grana, [place]. The care management team was consulted for assistance with disease management and care coordination needs.    Engaged with patient by telephone for initial visit in response to provider referral for case management and/or care coordination services.   Consent to Services:  The patient was given the following information about Chronic Care Management services: 1. CCM service includes personalized support from designated clinical staff supervised by the primary care provider, including individualized plan of care and coordination with other care providers 2. 24/7 contact phone numbers for assistance for urgent and routine care needs. 3. Service will only be billed when office clinical staff spend 20 minutes or more in a month to coordinate care. 4. Only one practitioner may furnish and bill the service in a calendar month. 5.The patient may stop CCM services at any time (effective at the end of the month) by phone call to the office staff. 6. The patient will be responsible for cost sharing (co-pay) of up to 20% of the service fee (after annual deductible is met). Patient agreed to services and consent obtained.   Assessment: Review of patient past medical history, allergies, medications, health status, including review of consultants reports, laboratory and other test data, was performed as part of comprehensive evaluation and provision of chronic care management services.   SDOH (Social Determinants of Health) assessments and interventions performed:  SDOH Interventions    Flowsheet Row Most Recent Value  SDOH Interventions   Food Insecurity Interventions Intervention Not Indicated  Transportation Interventions Intervention Not Indicated        CCM Care  Plan  Allergies  Allergen Reactions   Atorvastatin     Constipation    Outpatient Encounter Medications as of 09/14/2020  Medication Sig   Ascorbic Acid (VITAMIN C PO) Take by mouth daily.   aspirin EC 81 MG tablet Take 1 tablet (81 mg total) daily by mouth.   Multiple Vitamin (MULTIVITAMIN) tablet Take 1 tablet by mouth daily.   pravastatin (PRAVACHOL) 20 MG tablet Take 1 tablet (20 mg total) by mouth daily. At bedtime   pantoprazole (PROTONIX) 20 MG tablet Take 1 tablet (20 mg total) by mouth daily. (Patient not taking: Reported on 09/14/2020)   No facility-administered encounter medications on file as of 09/14/2020.    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  Desiree Carpenter reports doing very well. Hyperlipidemia is well controlled. She does not require assistance for chronic disease management. Agreed to call with questions or if her condition changes. The care management team will gladly assist.    Horris Latino [facility] Care Management [facility] [phone]

## 2020-09-20 ENCOUNTER — Telehealth: Payer: Self-pay

## 2020-09-20 NOTE — Telephone Encounter (Signed)
Left voicemail, following up to check on the status of bone density testing ordered at her annual exam. Left scheduling/callback information.

## 2020-09-21 ENCOUNTER — Other Ambulatory Visit: Payer: Self-pay | Admitting: Family Medicine

## 2020-09-21 DIAGNOSIS — Z1231 Encounter for screening mammogram for malignant neoplasm of breast: Secondary | ICD-10-CM

## 2020-10-10 ENCOUNTER — Other Ambulatory Visit: Payer: Self-pay

## 2020-10-10 ENCOUNTER — Ambulatory Visit
Admission: RE | Admit: 2020-10-10 | Discharge: 2020-10-10 | Disposition: A | Discharge status: Home / Self Care | Payer: Medicare HMO | Source: Ambulatory Visit | Attending: Family Medicine | Admitting: Family Medicine

## 2020-10-10 DIAGNOSIS — Z1231 Encounter for screening mammogram for malignant neoplasm of breast: Secondary | ICD-10-CM | POA: Diagnosis not present

## 2021-02-17 IMAGING — MG DIGITAL SCREENING BILAT W/ TOMO W/ CAD
8 series · 8 of 24 positions shown · non-contrast
Comparison: Previous exam(s).

CLINICAL DATA: Screening.

EXAM:
DIGITAL SCREENING BILATERAL MAMMOGRAM WITH TOMO AND CAD

[L CC synth-2D]
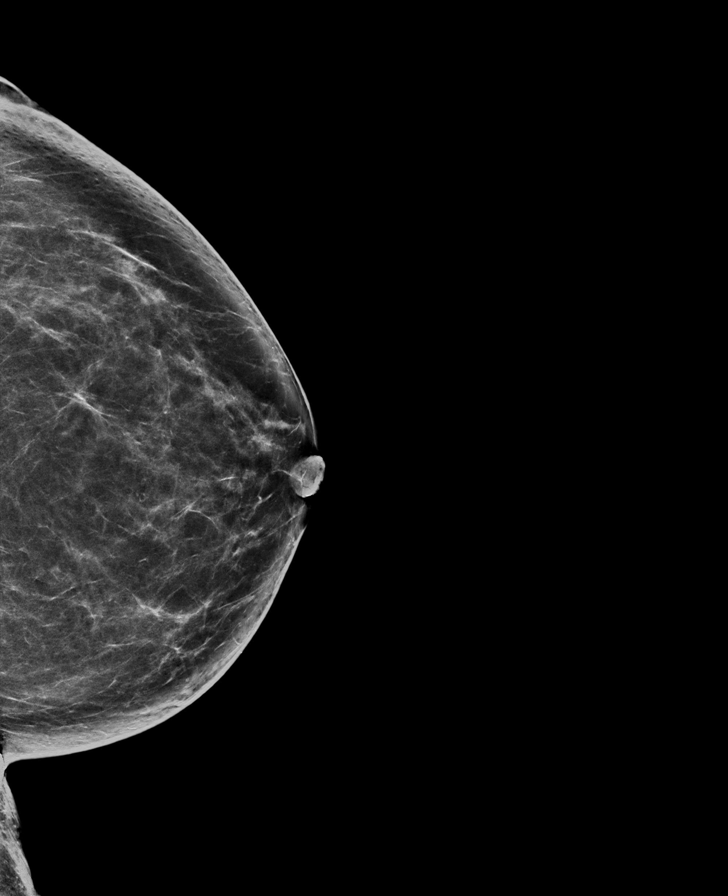

[L MLO synth-2D]
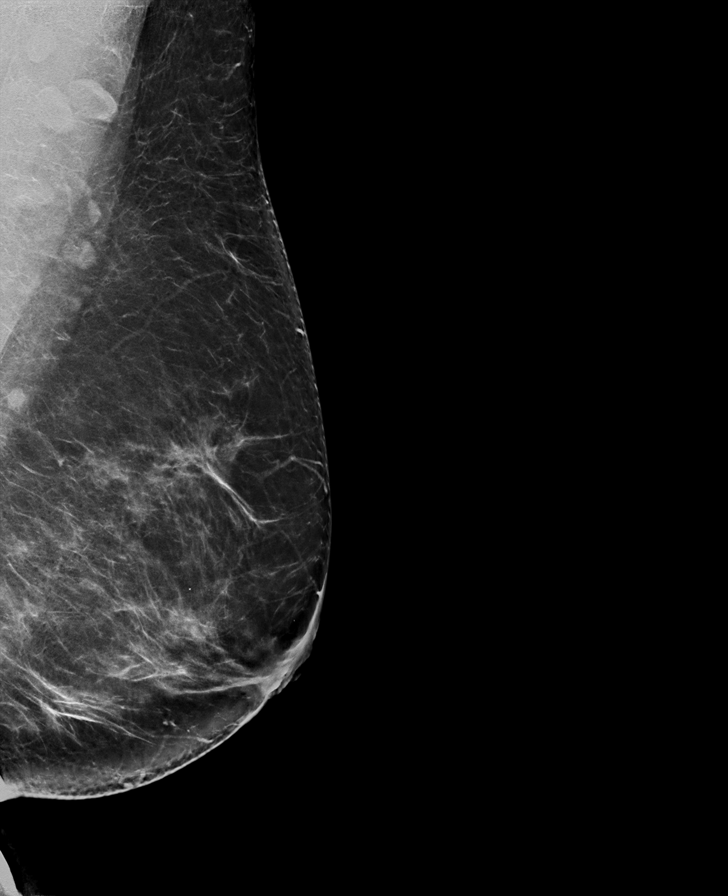

[R MLO synth-2D]
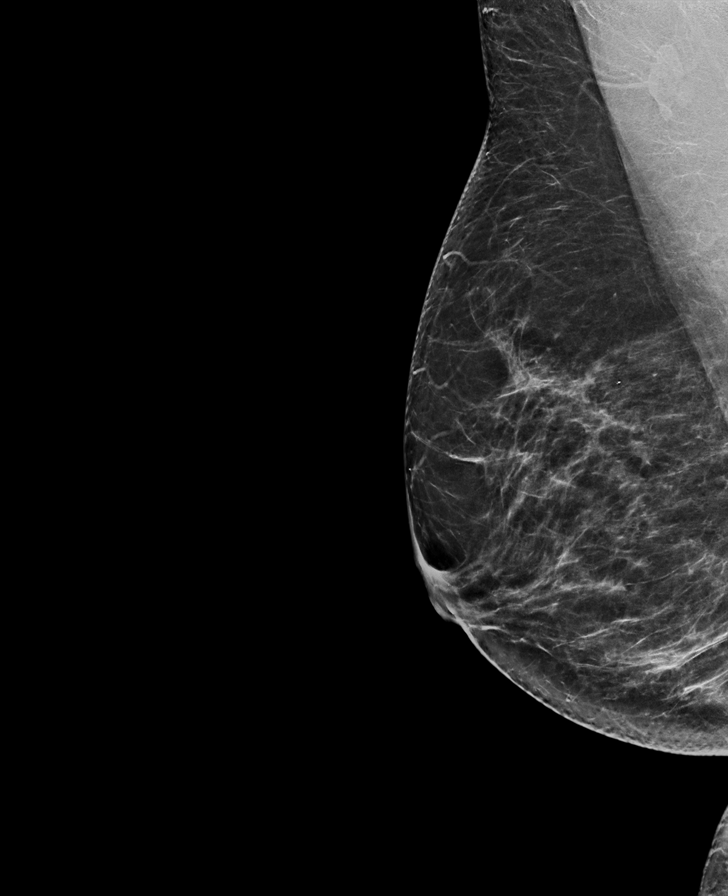

[R CC synth-2D]
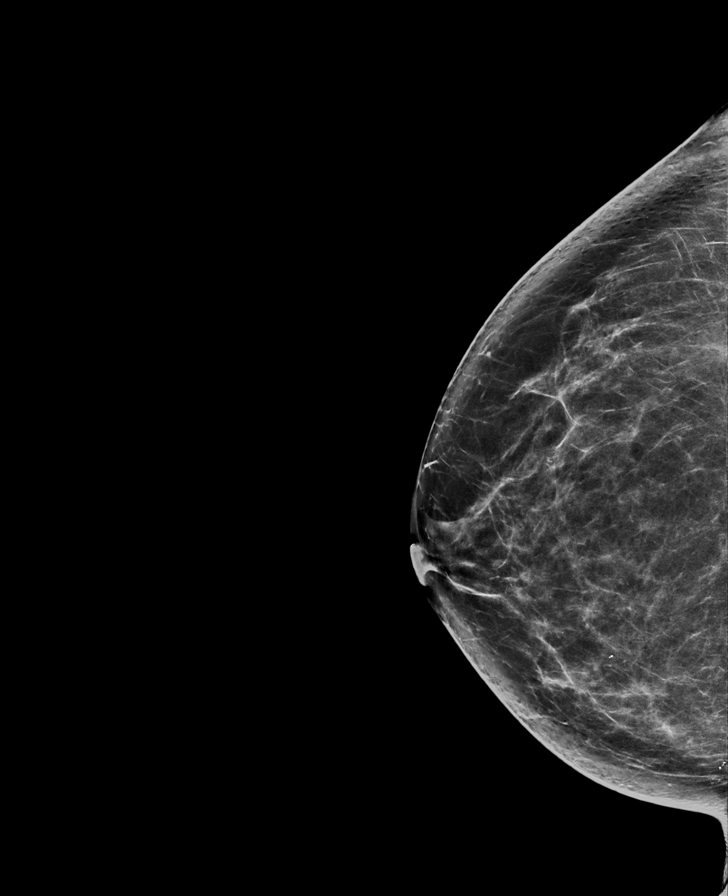

[R CC tomo · tomo slice 41/80.0]
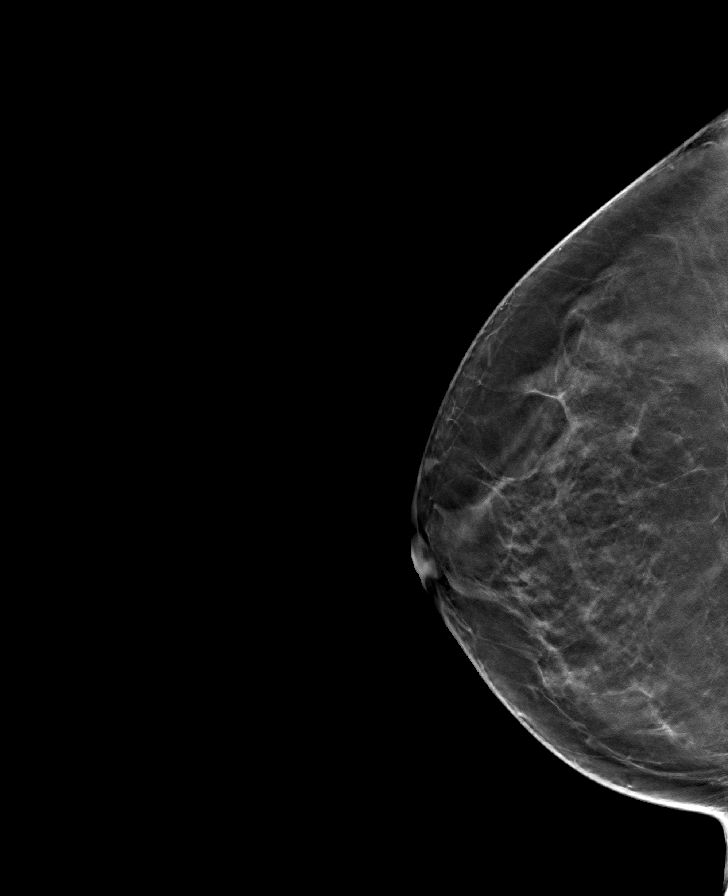

[L CC tomo · tomo slice 43/84.0]
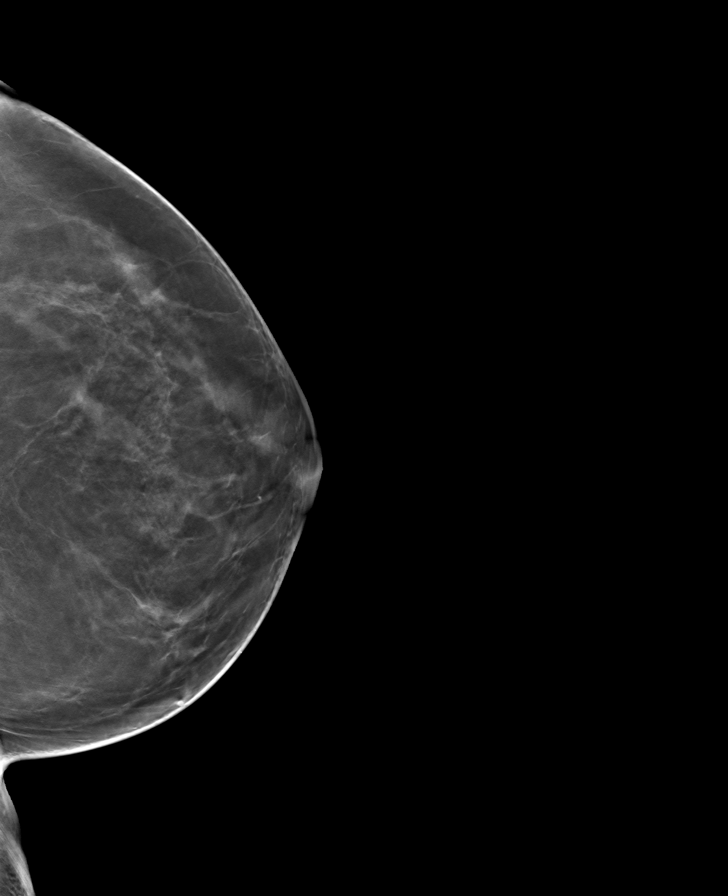

[R MLO tomo · tomo slice 39/76.0]
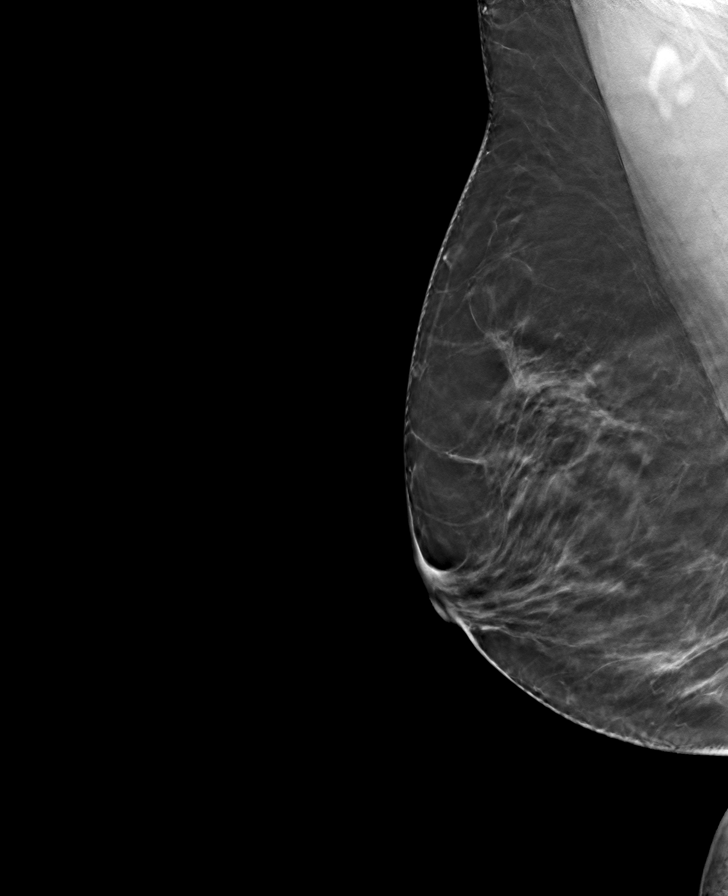

[L MLO tomo · tomo slice 46/91.0]
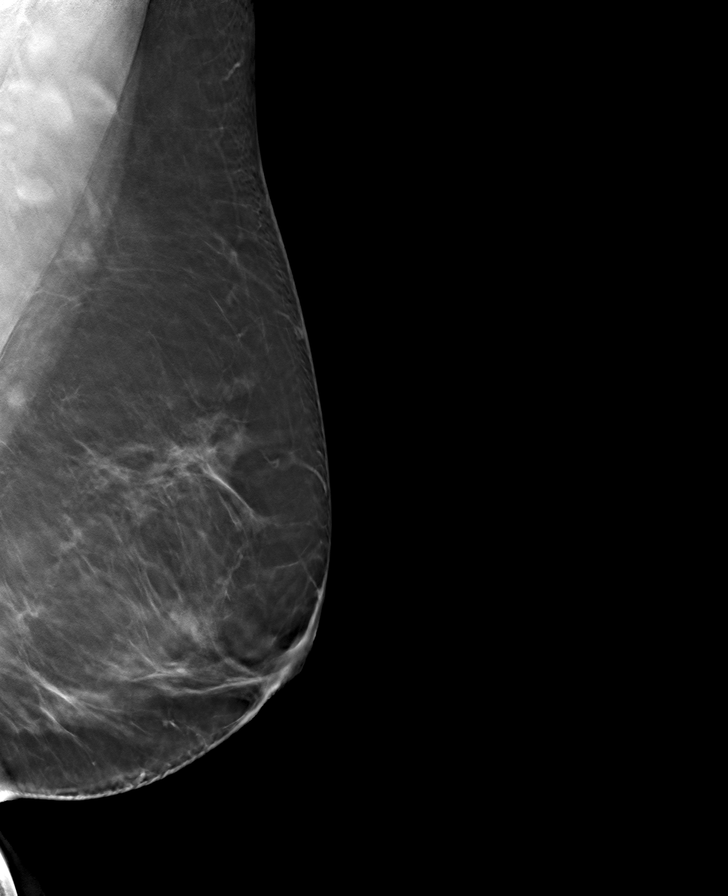

[8 of 24 positions shown; findings below may reference images not displayed]

ACR Breast Density Category b: There are scattered areas of
fibroglandular density.
FINDINGS: There are no findings suspicious for malignancy. Images were
processed with CAD.
IMPRESSION: No mammographic evidence of malignancy. A result letter of this
screening mammogram will be mailed directly to the patient.

RECOMMENDATION:
Screening mammogram in one year. (Code:[id number])

BI-RADS CATEGORY  1: Negative.

## 2021-02-22 ENCOUNTER — Ambulatory Visit (INDEPENDENT_AMBULATORY_CARE_PROVIDER_SITE_OTHER): Payer: Medicare HMO | Admitting: Physician Assistant

## 2021-02-22 ENCOUNTER — Encounter: Payer: Self-pay | Admitting: Physician Assistant

## 2021-02-22 ENCOUNTER — Other Ambulatory Visit: Payer: Self-pay

## 2021-02-22 VITALS — BP 116/62 | HR 87 | Temp 98.0°F | Resp 16 | Ht 63.0 in | Wt 185.0 lb

## 2021-02-22 DIAGNOSIS — Z6832 Body mass index (BMI) 32.0-32.9, adult: Secondary | ICD-10-CM

## 2021-02-22 DIAGNOSIS — E6609 Other obesity due to excess calories: Secondary | ICD-10-CM

## 2021-02-22 DIAGNOSIS — E785 Hyperlipidemia, unspecified: Secondary | ICD-10-CM | POA: Diagnosis not present

## 2021-02-22 DIAGNOSIS — M170 Bilateral primary osteoarthritis of knee: Secondary | ICD-10-CM

## 2021-02-22 LAB — CBC WITH DIFFERENTIAL/PLATELET
Absolute Monocytes: 322 cells/uL (ref 200–950)
Basophils Absolute: 9 cells/uL (ref 0–200)
Basophils Relative: 0.2 %
Eosinophils Absolute: 92 cells/uL (ref 15–500)
Eosinophils Relative: 2 %
HCT: 40.5 % (ref 35.0–45.0)
Hemoglobin: 13.1 g/dL (ref 11.7–15.5)
Lymphs Abs: 1283 cells/uL (ref 850–3900)
MCH: 28.1 pg (ref 27.0–33.0)
MCHC: 32.3 g/dL (ref 32.0–36.0)
MCV: 86.7 fL (ref 80.0–100.0)
MPV: 13.7 fL — ABNORMAL HIGH (ref 7.5–12.5)
Monocytes Relative: 7 %
Neutro Abs: 2893 cells/uL (ref 1500–7800)
Neutrophils Relative %: 62.9 %
Platelets: 186 10*3/uL (ref 140–400)
RBC: 4.67 10*6/uL (ref 3.80–5.10)
RDW: 12.2 % (ref 11.0–15.0)
Total Lymphocyte: 27.9 %
WBC: 4.6 10*3/uL (ref 3.8–10.8)

## 2021-02-22 LAB — COMPLETE METABOLIC PANEL WITH GFR
AG Ratio: 1.8 (calc) (ref 1.0–2.5)
ALT: 16 U/L (ref 6–29)
AST: 17 U/L (ref 10–35)
Albumin: 4.4 g/dL (ref 3.6–5.1)
Alkaline phosphatase (APISO): 59 U/L (ref 37–153)
BUN: 20 mg/dL (ref 7–25)
CO2: 28 mmol/L (ref 20–32)
Calcium: 9.8 mg/dL (ref 8.6–10.4)
Chloride: 106 mmol/L (ref 98–110)
Creat: 0.83 mg/dL (ref 0.50–1.05)
Globulin: 2.5 g/dL (calc) (ref 1.9–3.7)
Glucose, Bld: 92 mg/dL (ref 65–99)
Potassium: 4.8 mmol/L (ref 3.5–5.3)
Sodium: 141 mmol/L (ref 135–146)
Total Bilirubin: 0.5 mg/dL (ref 0.2–1.2)
Total Protein: 6.9 g/dL (ref 6.1–8.1)
eGFR: 76 mL/min/{1.73_m2} (ref >=60)

## 2021-02-22 LAB — LIPID PANEL
Cholesterol: 155 mg/dL (ref <200)
HDL: 70 mg/dL (ref >=50)
LDL Cholesterol (Calc): 68 mg/dL (calc)
Non-HDL Cholesterol (Calc): 85 mg/dL (calc) (ref <130)
Total CHOL/HDL Ratio: 2.2 (calc) (ref <5.0)
Triglycerides: 85 mg/dL (ref <150)

## 2021-02-22 NOTE — Patient Instructions (Addendum)
Try to increase your fiber over the next few weeks so that you are getting approx 20-30 grams of fiber per day to help with constipation concerns Please continue to walk and exercise. We recommend 150 minutes of moderate intensity physical activity per week to help with overall physical fitness and heart health Please continue to take your medications as prescribed  We will update you with the lab results when they are available  It was nice to meet you and I appreciate the opportunity to be involved in your care

## 2021-02-22 NOTE — Assessment & Plan Note (Signed)
Chronic, historic concern, stable Reports intermittent pain in bilateral knees States she is trying to increase physical activity  Discussed management strategies and potential referral to Orthopedics as needed for evaluation and interventions per her preference

## 2021-02-22 NOTE — Assessment & Plan Note (Signed)
Chronic, historic problem , well managed and stable  Currently taking Pravastatin and tolerating well Continue current medications Increase exercise and daily fiber intake Labs today for monitoring

## 2021-02-22 NOTE — Assessment & Plan Note (Signed)
Chronic, historic problem, stable Reports she is walking for 20-30 minutes 2-3 times per week Encouraged continuation and increase to at least 150 minutes per week

## 2021-02-22 NOTE — Progress Notes (Signed)
Established Patient Office Visit  Subjective:  Patient ID: Desiree Carpenter, female    DOB: 1950-12-19  Age: 71 y.o. MRN: [id number] Today's Provider: Talitha Givens, MHS, PA-C Introduced myself to the patient as a PA-C and provided education on APPs in clinical practice.    CC:  Chief Complaint  Patient presents with   Follow-up   Hyperlipidemia    HPI Desiree Carpenter presents for one year follow up for cholesterol management  Reports she is taking medications as directed Diet: She is following a typical diet without exclusions Exercise: is trying to incorporate more physical activity into daily life. States she is walking in neighborhood 2-3 times per week. Walking for 20-30 minutes at a time. 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   Mild hyperlipidemia 02/27/2018   Obesity 03/28/2016    Past Surgical History:  Procedure Laterality Date   ABDOMINAL HYSTERECTOMY     BUNIONECTOMY     COLONOSCOPY WITH PROPOFOL N/A 02/28/2017   Procedure: COLONOSCOPY WITH PROPOFOL;  Surgeon: Jonathon Bellows, MD;  Location: [facility] ENDOSCOPY;  Service: Gastroenterology;  Laterality: N/A;   COLONOSCOPY WITH PROPOFOL N/A 03/21/2018   Procedure: COLONOSCOPY WITH BIOPSY;  Surgeon: Lucilla Lame, MD;  Location: [facility];  Service: Endoscopy;  Laterality: N/A;    Family History  Problem Relation Age of Onset   Cancer Mother        breast (18), colon (42)   Breast cancer Mother 58   Cancer Father 39       colon   Cancer Maternal Grandmother        breast   Breast cancer Maternal Grandmother 52   Heart disease Maternal Grandfather        heart failure   Cancer Maternal Grandfather        lung   Stroke Paternal Grandmother     Social History   Socioeconomic History    Marital status: Married    Spouse name: Not on file   Number of children: 1   Years of education: Not on file   Highest education level: Bachelor's degree (e.g., BA, AB, BS)  Occupational History   Occupation: retired  Tobacco Use   Smoking status: Never   Smokeless tobacco: Never  Vaping Use   Vaping Use: Never used  Substance and Sexual Activity   Alcohol use: No    Alcohol/week: 0.0 standard drinks   Drug use: No   Sexual activity: Not Currently  Other Topics Concern   Not on file  Social History Narrative   Pt has one child who lives in [place] and her mother recently came to live with her   Social Determinants of Health   Financial Resource Strain: Low Risk    Difficulty of Paying Living Expenses: Not hard at all  Food Insecurity: No Food Insecurity   Worried About Charity fundraiser in the Last Year: Never true   Arboriculturist in the Last Year: Never true  Transportation Needs: No Transportation Needs   Lack of Transportation (Medical): No   Lack of Transportation (Non-Medical): No  Physical Activity: Insufficiently Active   Days of Exercise per Week: 3 days   Minutes of Exercise per Session: 30 min  Stress: No Stress Concern Present  Feeling of Stress : Not at all  Social Connections: Moderately Integrated   Frequency of Communication with Friends and Family: More than three times a week   Frequency of Social Gatherings with Friends and Family: More than three times a week   Attends Religious Services: More than 4 times per year   Active Member of [employer] or Organizations: No   Attends Archivist Meetings: Never   Marital Status: Married  Human resources officer Violence: Not At Risk   Fear of Current or Ex-Partner: No   Emotionally Abused: No   Physically Abused: No   Sexually Abused: No    Outpatient Medications Prior to Visit  Medication Sig Dispense Refill   Ascorbic Acid (VITAMIN C PO) Take by mouth daily.     aspirin EC 81 MG tablet Take 1  tablet (81 mg total) daily by mouth.     Multiple Vitamin (MULTIVITAMIN) tablet Take 1 tablet by mouth daily.     pravastatin (PRAVACHOL) 20 MG tablet Take 1 tablet (20 mg total) by mouth daily. At bedtime 90 tablet 3   pantoprazole (PROTONIX) 20 MG tablet Take 1 tablet (20 mg total) by mouth daily. (Patient not taking: Reported on 02/22/2021) 90 tablet 3   No facility-administered medications prior to visit.    Allergies  Allergen Reactions   Atorvastatin     Constipation    ROS Review of Systems  Constitutional:  Negative for fatigue.  Respiratory:  Negative for shortness of breath.   Cardiovascular:  Negative for chest pain and palpitations.  Gastrointestinal:  Positive for constipation. Negative for diarrhea, nausea and vomiting.  Musculoskeletal:  Positive for arthralgias. Negative for myalgias.  Neurological:  Negative for dizziness, light-headedness and headaches.     Objective:    Physical Exam Vitals reviewed.  Constitutional:      General: She is awake. She is not in acute distress.    Appearance: Normal appearance. She is well-developed and well-groomed. She is obese.  HENT:     Head: Normocephalic and atraumatic.  Eyes:     General: Lids are normal.     Extraocular Movements: Extraocular movements intact.     Conjunctiva/sclera: Conjunctivae normal.     Pupils: Pupils are equal, round, and reactive to light.  Cardiovascular:     Rate and Rhythm: Normal rate and regular rhythm.     Pulses: Normal pulses.     Heart sounds: Normal heart sounds.  Pulmonary:     Effort: Pulmonary effort is normal.     Breath sounds: Normal breath sounds. No wheezing, rhonchi or rales.  Musculoskeletal:        General: Normal range of motion.     Cervical back: Normal range of motion and neck supple.     Right lower leg: No edema.     Left lower leg: No edema.  Lymphadenopathy:     Head:     Right side of head: No submental or submandibular adenopathy.     Left side of head:  No submental or submandibular adenopathy.     Upper Body:     Right upper body: No supraclavicular adenopathy.     Left upper body: No supraclavicular adenopathy.  Skin:    General: Skin is warm.  Neurological:     Mental Status: She is alert.  Psychiatric:        Mood and Affect: Mood normal.        Behavior: Behavior normal. Behavior is cooperative.  Thought Content: Thought content normal.        Judgment: Judgment normal.    BP 116/62    Pulse 87    Temp 98 F (36.7 C) (Oral)    Resp 16    Ht 5\' 3"  (1.6 m)    Wt 185 lb (83.9 kg)    SpO2 98%    BMI 32.77 kg/m  Wt Readings from Last 3 Encounters:  02/22/21 185 lb (83.9 kg)  02/23/20 186 lb 3.2 oz (84.5 kg)  01/06/20 187 lb 12.8 oz (85.2 kg)     Health Maintenance Due  Topic Date Due   Zoster Vaccines- Shingrix (1 of 2) Never done   DEXA SCAN  01/30/2014   COVID-19 Vaccine (3 - Booster for Moderna series) 06/13/2019   TETANUS/TDAP  10/04/2020    There are no preventive care reminders to display for this patient.  Lab Results  Component Value Date   TSH 1.73 02/27/2018   Lab Results  Component Value Date   WBC 4.9 02/23/2020   HGB 12.9 02/23/2020   HCT 40.9 02/23/2020   MCV 86.5 02/23/2020   PLT 182 02/23/2020   Lab Results  Component Value Date   NA 142 02/23/2020   K 4.4 02/23/2020   CO2 30 02/23/2020   GLUCOSE 85 02/23/2020   BUN 16 02/23/2020   CREATININE 0.83 02/23/2020   BILITOT 0.5 02/23/2020   ALKPHOS 58 03/28/2016   AST 17 02/23/2020   ALT 12 02/23/2020   PROT 6.9 02/23/2020   ALBUMIN 4.5 03/28/2016   CALCIUM 9.7 02/23/2020   Lab Results  Component Value Date   CHOL 136 02/23/2020   Lab Results  Component Value Date   HDL 62 02/23/2020   Lab Results  Component Value Date   LDLCALC 61 02/23/2020   Lab Results  Component Value Date   TRIG 58 02/23/2020   Lab Results  Component Value Date   CHOLHDL 2.2 02/23/2020   Lab Results  Component Value Date   HGBA1C 5.5  03/10/2016      Assessment & Plan:   Problem List Items Addressed This Visit       Musculoskeletal and Integument   Arthritis of both knees (Chronic)    Chronic, historic concern, stable Reports intermittent pain in bilateral knees States she is trying to increase physical activity  Discussed management strategies and potential referral to Orthopedics as needed for evaluation and interventions per her preference         Other   Obesity (Chronic)    Chronic, historic problem, stable Reports she is walking for 20-30 minutes 2-3 times per week Encouraged continuation and increase to at least 150 minutes per week        Relevant Orders   CBC with Differential/Platelet   COMPLETE METABOLIC PANEL WITH GFR   Lipid panel   Mild hyperlipidemia - Primary    Chronic, historic problem , well managed and stable  Currently taking Pravastatin and tolerating well Continue current medications Increase exercise and daily fiber intake Labs today for monitoring       Relevant Orders   Lipid panel     Return in about 1 year (around 02/22/2022).   I, Saidah Kempton E Lovada Barwick, PA-C, have reviewed all documentation for this visit. The documentation on 02/22/21 for the exam, diagnosis, procedures, and orders are all accurate and complete.   Kada Friesen, Glennie [place] MPH [facility]

## 2021-02-23 ENCOUNTER — Ambulatory Visit (INDEPENDENT_AMBULATORY_CARE_PROVIDER_SITE_OTHER): Payer: Medicare HMO

## 2021-02-23 DIAGNOSIS — Z780 Asymptomatic menopausal state: Secondary | ICD-10-CM | POA: Diagnosis not present

## 2021-02-23 DIAGNOSIS — Z0000 Encounter for general adult medical examination without abnormal findings: Secondary | ICD-10-CM

## 2021-02-23 NOTE — Progress Notes (Signed)
Subjective:   Desiree Carpenter is a 71 y.o. female who presents for Medicare Annual (Subsequent) preventive examination.  Virtual Visit via Telephone Note  I connected with  Desiree Carpenter on 02/23/21 at 10:40 AM EST by telephone and verified that I am speaking with the correct person using two identifiers.  Location: Patient: home Provider: [place] Persons participating in the virtual visit: Desiree Carpenter   I discussed the limitations, risks, security and privacy concerns of performing an evaluation and management service by telephone and the availability of in person appointments. The patient expressed understanding and agreed to proceed.  Interactive audio and video telecommunications were attempted between this nurse and patient, however failed, due to patient having technical difficulties OR patient did not have access to video capability.  We continued and completed visit with audio only.  Some vital signs may be absent or patient reported.   Desiree Marker, LPN   Review of Systems     Cardiac Risk Factors include: advanced age (>11men, >74 women);dyslipidemia;obesity (BMI >30kg/m2)     Objective:    There were no vitals filed for this visit. There is no height or weight on file to calculate BMI.  Advanced Directives 02/23/2021 02/23/2020 02/19/2019 10/02/2018 03/31/2018 03/21/2018 03/17/2018  Does Patient Have a Medical Advance Directive? No No No No No No No  Does patient want to make changes to medical advance directive? - - No - Patient declined - - - -  Would patient like information on creating a medical advance directive? No - Patient declined Yes (MAU/Ambulatory/Procedural Areas - Information given) - No - Patient declined No - Patient declined No - Patient declined No - Patient declined    Current Medications (verified) Outpatient Encounter Medications as of 02/23/2021  Medication Sig   Ascorbic Acid (VITAMIN C PO) Take by mouth daily.   aspirin EC 81 MG  tablet Take 1 tablet (81 mg total) daily by mouth.   Multiple Vitamin (MULTIVITAMIN) tablet Take 1 tablet by mouth daily.   pravastatin (PRAVACHOL) 20 MG tablet Take 1 tablet (20 mg total) by mouth daily. At bedtime   No facility-administered encounter medications on file as of 02/23/2021.    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   Mild hyperlipidemia 02/27/2018   Obesity 03/28/2016   Past Surgical History:  Procedure Laterality Date   ABDOMINAL HYSTERECTOMY     BUNIONECTOMY     COLONOSCOPY WITH PROPOFOL N/A 02/28/2017   Procedure: COLONOSCOPY WITH PROPOFOL;  Surgeon: Jonathon Bellows, MD;  Location: [facility];  Service: Gastroenterology;  Laterality: N/A;   COLONOSCOPY WITH PROPOFOL N/A 03/21/2018   Procedure: COLONOSCOPY WITH BIOPSY;  Surgeon: Lucilla Lame, MD;  Location: [place];  Service: Endoscopy;  Laterality: N/A;   Family History  Problem Relation Age of Onset   Cancer Mother        breast (92), colon (55)   Breast cancer Mother 66   Cancer Father        colon   Cancer Maternal Grandmother        breast   Breast cancer Maternal Grandmother 52   Heart disease Maternal Grandfather        heart failure   Cancer Maternal Grandfather        lung   Stroke Paternal Grandmother    Social History   Socioeconomic  History   Marital status: Married    Spouse name: Not on file   Number of children: 1   Years of education: Not on file   Highest education level: Bachelor's degree (e.g., BA, AB, BS)  Occupational History   Occupation: retired  Tobacco Use   Smoking status: Never   Smokeless tobacco: Never  Vaping Use   Vaping Use: Never used  Substance and Sexual Activity   Alcohol use: No   Drug use: No   Sexual activity: Not Currently  Other Topics Concern   Not on file  Social History Narrative    Pt has one child who lives in [place] and her mother recently came to live with her   Social Determinants of Health   Financial Resource Strain: Low Risk    Difficulty of Paying Living Expenses: Not hard at all  Food Insecurity: No Food Insecurity   Worried About Charity fundraiser in the Last Year: Never true   Arboriculturist in the Last Year: Never true  Transportation Needs: No Transportation Needs   Lack of Transportation (Medical): No   Lack of Transportation (Non-Medical): No  Physical Activity: Insufficiently Active   Days of Exercise per Week: 2 days   Minutes of Exercise per Session: 30 min  Stress: No Stress Concern Present   Feeling of Stress : Not at all  Social Connections: Moderately Integrated   Frequency of Communication with Friends and Family: More than three times a week   Frequency of Social Gatherings with Friends and Family: More than three times a week   Attends Religious Services: More than 4 times per year   Active Member of [employer] or Organizations: No   Attends Archivist Meetings: Never   Marital Status: Married    Tobacco Counseling Counseling given: Not Answered   Clinical Intake:                          Activities of Daily Living In your present state of health, do you have any difficulty performing the following activities: 02/23/2021 02/22/2021  Hearing? N N  Vision? N N  Difficulty concentrating or making decisions? N N  Walking or climbing stairs? N N  Dressing or bathing? N N  Doing errands, shopping? N N  Preparing Food and eating ? N -  Using the Toilet? N -  In the past six months, have you accidently leaked urine? N -  Do you have problems with loss of bowel control? N -  Managing your Medications? N -  Managing your Finances? N -  Housekeeping or managing your Housekeeping? N -  Some recent data might be hidden    Patient Care Team: Desiree Grana, PA-C as PCP - General (Family Medicine) Desiree Carpenter, OD (Optometry)  Indicate any recent Medical Services you may have received from other than [facility] providers in the past year (date may be approximate).     Assessment:   This is a routine wellness examination for [name].  Hearing/Vision screen Hearing Screening - Comments:: Pt denies hearing difficulty  Vision Screening - Comments:: Vision screenings by Dr. Ellin Mayhew  Dietary issues and exercise activities discussed: Current Exercise Habits: Home exercise routine, Type of exercise: walking, Time (Minutes): 30, Frequency (Times/Week): 2, Weekly Exercise (Minutes/Week): 60, Intensity: Mild, Exercise limited by: None identified   Goals Addressed             This Visit's Progress  Increase physical activity   Not on track    Pt would like to increase physical activity to 150 minutes per week       Depression Screen PHQ 2/9 Scores 02/23/2021 02/22/2021 02/23/2020 02/23/2020 01/06/2020 03/10/2019 02/19/2019  PHQ - 2 Score 0 0 0 0 0 0 0  PHQ- 9 Score 0 0 0 - - 0 -    Fall Risk Fall Risk  02/23/2021 02/22/2021 09/14/2020 02/23/2020 02/23/2020  Carpenter in the past year? 0 0 0 0 0  Number Carpenter in past yr: 0 0 0 0 0  Injury with Fall? 0 0 - 0 0  Risk for fall due to : No Fall Risks No Fall Risks - - No Fall Risks  Follow up Carpenter prevention discussed Carpenter prevention discussed - - Carpenter prevention discussed    FALL RISK PREVENTION PERTAINING TO THE HOME:  Any stairs in or around the home? Yes  If so, are there any without handrails? No  Home free of loose throw rugs in walkways, pet beds, electrical cords, etc? Yes  Adequate lighting in your home to reduce risk of Carpenter? Yes   ASSISTIVE DEVICES UTILIZED TO PREVENT Carpenter:  Life alert? No  Use of a cane, walker or w/c? No  Grab bars in the bathroom? Yes  Shower chair or bench in shower? Yes  Elevated toilet seat or a handicapped toilet? No   TIMED UP AND GO:  Was the test performed? No . Telephonic visit.   Cognitive Function: Normal  cognitive status assessed by direct observation by this Nurse Health Advisor. No abnormalities found.       6CIT Screen 02/19/2019 02/14/2018  What Year? 0 points 0 points  What month? 0 points 0 points  What time? 0 points 0 points  Count back from 20 0 points 0 points  Months in reverse 0 points 0 points  Repeat phrase 0 points 0 points  Total Score 0 0    Immunizations Immunization History  Administered Date(s) Administered   Fluad Quad(high Dose 65+) 10/27/2018   Influenza, High Dose Seasonal PF 10/11/2017, 11/18/2019   Influenza,inj,Quad PF,6+ Mos 10/19/2014   Influenza-Unspecified 10/11/2017, 11/08/2020   Moderna Sars-Covid-2 Vaccination 03/21/2019, 04/18/2019   Pneumococcal Conjugate-13 05/06/2017   Pneumococcal Polysaccharide-23 09/01/2018   Tdap 10/05/2010    TDAP status: Due, Education has been provided regarding the importance of this vaccine. Advised may receive this vaccine at local pharmacy or Health Dept. Aware to provide a copy of the vaccination record if obtained from local pharmacy or Health Dept. Verbalized acceptance and understanding.  Flu Vaccine status: Up to date  Pneumococcal vaccine status: Up to date  Covid-19 vaccine status: Completed vaccines  Qualifies for Shingles Vaccine? Yes   Zostavax completed No   Shingrix Completed?: No.    Education has been provided regarding the importance of this vaccine. Patient has been advised to call insurance company to determine out of pocket expense if they have not yet received this vaccine. Advised may also receive vaccine at local pharmacy or Health Dept. Verbalized acceptance and understanding.  Screening Tests Health Maintenance  Topic Date Due   Zoster Vaccines- Shingrix (1 of 2) Never done   DEXA SCAN  01/30/2014   COVID-19 Vaccine (3 - Booster for Moderna series) 06/13/2019   TETANUS/TDAP  10/04/2020   MAMMOGRAM  10/10/2021   COLONOSCOPY (Pts 45-23yrs Insurance coverage will need to be confirmed)   03/22/2023   Pneumonia Vaccine 69+ Years old  Completed   INFLUENZA  VACCINE  Completed   Hepatitis C Screening  Completed   HPV VACCINES  Aged Out    Health Maintenance  Health Maintenance Due  Topic Date Due   Zoster Vaccines- Shingrix (1 of 2) Never done   DEXA SCAN  01/30/2014   COVID-19 Vaccine (3 - Booster for Moderna series) 06/13/2019   TETANUS/TDAP  10/04/2020    Colorectal cancer screening: Type of screening: Colonoscopy. Completed 03/01/18. Repeat every 5 years  Mammogram status: Completed 10/10/20. Repeat every year  Bone Density status: Ordered today. Pt provided with contact info and advised to call to schedule appt.  Lung Cancer Screening: (Low Dose CT Chest recommended if Age 79-80 years, 30 pack-year currently smoking OR have quit w/in 15years.) does not qualify.    Additional Screening:  Hepatitis C Screening: does qualify; Completed 03/28/16  Vision Screening: Recommended annual ophthalmology exams for early detection of glaucoma and other disorders of the eye. Is the patient up to date with their annual eye exam?  Yes  Who is the provider or what is the name of the office in which the patient attends annual eye exams? Dr. Ellin Mayhew.   Dental Screening: Recommended annual dental exams for proper oral hygiene  Community Resource Referral / Chronic Care Management: CRR required this visit?  No   CCM required this visit?  No      Plan:     I have personally reviewed and noted the following in the patients chart:   Medical and social history Use of alcohol, tobacco or illicit drugs  Current medications and supplements including opioid prescriptions.  Functional ability and status Nutritional status Physical activity Advanced directives List of other physicians Hospitalizations, surgeries, and ER visits in previous 12 months Vitals Screenings to include cognitive, depression, and Carpenter Referrals and appointments  In addition, I have reviewed and  discussed with patient certain preventive protocols, quality metrics, and best practice recommendations. A written personalized care plan for preventive services as well as general preventive health recommendations were provided to patient.   Due to this being a telephonic visit, the after visit summary with patients personalized plan was offered to patient via my-chart.   Desiree Marker, LPN   03/02/209   Nurse Notes: none

## 2021-02-23 NOTE — Patient Instructions (Addendum)
[name] , Thank you for taking time to come for your Medicare Wellness Visit. I appreciate your ongoing commitment to your health goals. Please review the following plan we discussed and let me know if I can assist you in the future.   Screening recommendations/referrals: Colonoscopy: done 03/01/18; repeat 02/2023 Mammogram: done 10/10/20 Bone Density: Please call [phone] to schedule your bone density screening.  Recommended yearly ophthalmology/optometry visit for glaucoma screening and checkup Recommended yearly dental visit for hygiene and checkup  Vaccinations: Influenza vaccine: done 11/08/20 Pneumococcal vaccine: done 09/01/18 Tdap vaccine: due Shingles vaccine: Shingrix discussed. Please contact your pharmacy for coverage information.  Covid-19: done 03/21/19 & 04/18/19  Advanced directives: Advance directive discussed with you today. Even though you declined this today please call our office should you change your mind and we can give you the proper paperwork for you to fill out.   Conditions/risks identified: Recommend increasing physical activity   Next appointment: Follow up in one year for your annual wellness visit    Preventive Care 65 Years and Older, Female Preventive care refers to lifestyle choices and visits with your health care provider that can promote health and wellness. What does preventive care include? A yearly physical exam. This is also called an annual well check. Dental exams once or twice a year. Routine eye exams. Ask your health care provider how often you should have your eyes checked. Personal lifestyle choices, including: Daily care of your teeth and gums. Regular physical activity. Eating a healthy diet. Avoiding tobacco and drug use. Limiting alcohol use. Practicing safe sex. Taking low-dose aspirin every day. Taking vitamin and mineral supplements as recommended by your health care provider. What happens during an annual well  check? The services and screenings done by your health care provider during your annual well check will depend on your age, overall health, lifestyle risk factors, and family history of disease. Counseling  Your health care provider may ask you questions about your: Alcohol use. Tobacco use. Drug use. Emotional well-being. Home and relationship well-being. Sexual activity. Eating habits. History of falls. Memory and ability to understand (cognition). Work and work Statistician. Reproductive health. Screening  You may have the following tests or measurements: Height, weight, and BMI. Blood pressure. Lipid and cholesterol levels. These may be checked every 5 years, or more frequently if you are over 31 years old. Skin check. Lung cancer screening. You may have this screening every year starting at age 47 if you have a 30-pack-year history of smoking and currently smoke or have quit within the past 15 years. Fecal occult blood test (FOBT) of the stool. You may have this test every year starting at age 49. Flexible sigmoidoscopy or colonoscopy. You may have a sigmoidoscopy every 5 years or a colonoscopy every 10 years starting at age 46. Hepatitis C blood test. Hepatitis B blood test. Sexually transmitted disease (STD) testing. Diabetes screening. This is done by checking your blood sugar (glucose) after you have not eaten for a while (fasting). You may have this done every 1-3 years. Bone density scan. This is done to screen for osteoporosis. You may have this done starting at age 65. Mammogram. This may be done every 1-2 years. Talk to your health care provider about how often you should have regular mammograms. Talk with your health care provider about your test results, treatment options, and if necessary, the need for more tests. Vaccines  Your health care provider may recommend certain vaccines, such as: Influenza vaccine. This is  recommended every year. Tetanus, diphtheria, and  acellular pertussis (Tdap, Td) vaccine. You may need a Td booster every 10 years. Zoster vaccine. You may need this after age 15. Pneumococcal 13-valent conjugate (PCV13) vaccine. One dose is recommended after age 35. Pneumococcal polysaccharide (PPSV23) vaccine. One dose is recommended after age 28. Talk to your health care provider about which screenings and vaccines you need and how often you need them. This information is not intended to replace advice given to you by your health care provider. Make sure you discuss any questions you have with your health care provider. Document Released: 02/04/2015 Document Revised: 09/28/2015 Document Reviewed: 11/09/2014 Elsevier Interactive Patient Education  2017 [place] Prevention in the Home Falls can cause injuries. They can happen to people of all ages. There are many things you can do to make your home safe and to help prevent falls. What can I do on the outside of my home? Regularly fix the edges of walkways and driveways and fix any cracks. Remove anything that might make you trip as you walk through a door, such as a raised step or threshold. Trim any bushes or trees on the path to your home. Use bright outdoor lighting. Clear any walking paths of anything that might make someone trip, such as rocks or tools. Regularly check to see if handrails are loose or broken. Make sure that both sides of any steps have handrails. Any raised decks and porches should have guardrails on the edges. Have any leaves, snow, or ice cleared regularly. Use sand or salt on walking paths during winter. Clean up any spills in your garage right away. This includes oil or grease spills. What can I do in the bathroom? Use night lights. Install grab bars by the toilet and in the tub and shower. Do not use towel bars as grab bars. Use non-skid mats or decals in the tub or shower. If you need to sit down in the shower, use a plastic, non-slip stool. Keep  the floor dry. Clean up any water that spills on the floor as soon as it happens. Remove soap buildup in the tub or shower regularly. Attach bath mats securely with double-sided non-slip rug tape. Do not have throw rugs and other things on the floor that can make you trip. What can I do in the bedroom? Use night lights. Make sure that you have a light by your bed that is easy to reach. Do not use any sheets or blankets that are too big for your bed. They should not hang down onto the floor. Have a firm chair that has side arms. You can use this for support while you get dressed. Do not have throw rugs and other things on the floor that can make you trip. What can I do in the kitchen? Clean up any spills right away. Avoid walking on wet floors. Keep items that you use a lot in easy-to-reach places. If you need to reach something above you, use a strong step stool that has a grab bar. Keep electrical cords out of the way. Do not use floor polish or wax that makes floors slippery. If you must use wax, use non-skid floor wax. Do not have throw rugs and other things on the floor that can make you trip. What can I do with my stairs? Do not leave any items on the stairs. Make sure that there are handrails on both sides of the stairs and use them. Fix handrails that are  broken or loose. Make sure that handrails are as long as the stairways. Check any carpeting to make sure that it is firmly attached to the stairs. Fix any carpet that is loose or worn. Avoid having throw rugs at the top or bottom of the stairs. If you do have throw rugs, attach them to the floor with carpet tape. Make sure that you have a light switch at the top of the stairs and the bottom of the stairs. If you do not have them, ask someone to add them for you. What else can I do to help prevent falls? Wear shoes that: Do not have high heels. Have rubber bottoms. Are comfortable and fit you well. Are closed at the toe. Do not  wear sandals. If you use a stepladder: Make sure that it is fully opened. Do not climb a closed stepladder. Make sure that both sides of the stepladder are locked into place. Ask someone to hold it for you, if possible. Clearly mark and make sure that you can see: Any grab bars or handrails. First and last steps. Where the edge of each step is. Use tools that help you move around (mobility aids) if they are needed. These include: Canes. Walkers. Scooters. Crutches. Turn on the lights when you go into a dark area. Replace any light bulbs as soon as they burn out. Set up your furniture so you have a clear path. Avoid moving your furniture around. If any of your floors are uneven, fix them. If there are any pets around you, be aware of where they are. Review your medicines with your doctor. Some medicines can make you feel dizzy. This can increase your chance of falling. Ask your doctor what other things that you can do to help prevent falls. This information is not intended to replace advice given to you by your health care provider. Make sure you discuss any questions you have with your health care provider. Document Released: 11/04/2008 Document Revised: 06/16/2015 Document Reviewed: 02/12/2014 Elsevier Interactive Patient Education  2017 [employer].

## 2021-02-23 NOTE — Addendum Note (Signed)
Addended by: Clemetine Marker D on: 02/23/2021 01:59 PM   Modules accepted: Orders

## 2021-04-13 ENCOUNTER — Other Ambulatory Visit: Payer: Self-pay | Admitting: Family Medicine

## 2021-09-18 ENCOUNTER — Other Ambulatory Visit: Payer: Self-pay | Admitting: Family Medicine

## 2021-09-18 DIAGNOSIS — Z1231 Encounter for screening mammogram for malignant neoplasm of breast: Secondary | ICD-10-CM

## 2021-10-17 ENCOUNTER — Inpatient Hospital Stay: Admission: RE | Admit: 2021-10-17 | Payer: Medicare HMO | Source: Ambulatory Visit

## 2021-10-31 ENCOUNTER — Ambulatory Visit
Admission: RE | Admit: 2021-10-31 | Discharge: 2021-10-31 | Disposition: A | Discharge status: Home / Self Care | Payer: Medicare HMO | Source: Ambulatory Visit | Attending: Family Medicine | Admitting: Family Medicine

## 2021-10-31 DIAGNOSIS — Z1231 Encounter for screening mammogram for malignant neoplasm of breast: Secondary | ICD-10-CM | POA: Insufficient documentation

## 2022-01-31 DIAGNOSIS — R1031 Right lower quadrant pain: Secondary | ICD-10-CM | POA: Diagnosis not present

## 2022-01-31 DIAGNOSIS — R109 Unspecified abdominal pain: Secondary | ICD-10-CM | POA: Diagnosis not present

## 2022-02-12 ENCOUNTER — Telehealth: Payer: Self-pay | Admitting: Family Medicine

## 2022-02-12 NOTE — Telephone Encounter (Signed)
LVM for pt to rtn my call to re-schedule AWV on 02/27/22 with [facility].

## 2022-02-27 ENCOUNTER — Ambulatory Visit: Payer: Medicare HMO

## 2022-03-02 ENCOUNTER — Ambulatory Visit (INDEPENDENT_AMBULATORY_CARE_PROVIDER_SITE_OTHER): Payer: Medicare HMO

## 2022-03-02 VITALS — Ht 63.0 in | Wt 185.0 lb

## 2022-03-02 DIAGNOSIS — Z0000 Encounter for general adult medical examination without abnormal findings: Secondary | ICD-10-CM

## 2022-03-02 DIAGNOSIS — Z1231 Encounter for screening mammogram for malignant neoplasm of breast: Secondary | ICD-10-CM

## 2022-03-02 DIAGNOSIS — Z13820 Encounter for screening for osteoporosis: Secondary | ICD-10-CM

## 2022-03-02 NOTE — Patient Instructions (Signed)
[name] , Thank you for taking time to come for your Medicare Wellness Visit. I appreciate your ongoing commitment to your health goals. Please review the following plan we discussed and let me know if I can assist you in the future.   These are the goals we discussed:  Goals      Increase physical activity     Pt would like to increase physical activity to 150 minutes per week     Weight (lb) < 168 lb (76.2 kg)        This is a list of the screening recommended for you and due dates:  Health Maintenance  Topic Date Due   Zoster (Shingles) Vaccine (1 of 2) Never done   DEXA scan (bone density measurement)  01/30/2014   DTaP/Tdap/Td vaccine (2 - Td or Tdap) 10/04/2020   Flu Shot  08/22/2021   COVID-19 Vaccine (3 - 2023-24 season) 09/22/2021   Mammogram  11/01/2022   Medicare Annual Wellness Visit  03/03/2023   Colon Cancer Screening  03/22/2023   Pneumonia Vaccine  Completed   Hepatitis C Screening: USPSTF Recommendation to screen - Ages 18-79 yo.  Completed   HPV Vaccine  Aged Out    Advanced directives: no  Conditions/risks identified: none  Next appointment: Follow up in one year for your annual wellness visit 03/14/2023 @1$ :30pm/telephone   Preventive Care 65 Years and Older, Female Preventive care refers to lifestyle choices and visits with your health care provider that can promote health and wellness. What does preventive care include? A yearly physical exam. This is also called an annual well check. Dental exams once or twice a year. Routine eye exams. Ask your health care provider how often you should have your eyes checked. Personal lifestyle choices, including: Daily care of your teeth and gums. Regular physical activity. Eating a healthy diet. Avoiding tobacco and drug use. Limiting alcohol use. Practicing safe sex. Taking low-dose aspirin every day. Taking vitamin and mineral supplements as recommended by your health care provider. What happens  during an annual well check? The services and screenings done by your health care provider during your annual well check will depend on your age, overall health, lifestyle risk factors, and family history of disease. Counseling  Your health care provider may ask you questions about your: Alcohol use. Tobacco use. Drug use. Emotional well-being. Home and relationship well-being. Sexual activity. Eating habits. History of falls. Memory and ability to understand (cognition). Work and work Statistician. Reproductive health. Screening  You may have the following tests or measurements: Height, weight, and BMI. Blood pressure. Lipid and cholesterol levels. These may be checked every 5 years, or more frequently if you are over 41 years old. Skin check. Lung cancer screening. You may have this screening every year starting at age 76 if you have a 30-pack-year history of smoking and currently smoke or have quit within the past 15 years. Fecal occult blood test (FOBT) of the stool. You may have this test every year starting at age 40. Flexible sigmoidoscopy or colonoscopy. You may have a sigmoidoscopy every 5 years or a colonoscopy every 10 years starting at age 6. Hepatitis C blood test. Hepatitis B blood test. Sexually transmitted disease (STD) testing. Diabetes screening. This is done by checking your blood sugar (glucose) after you have not eaten for a while (fasting). You may have this done every 1-3 years. Bone density scan. This is done to screen for osteoporosis. You may have this done starting at age 25.  Mammogram. This may be done every 1-2 years. Talk to your health care provider about how often you should have regular mammograms. Talk with your health care provider about your test results, treatment options, and if necessary, the need for more tests. Vaccines  Your health care provider may recommend certain vaccines, such as: Influenza vaccine. This is recommended every  year. Tetanus, diphtheria, and acellular pertussis (Tdap, Td) vaccine. You may need a Td booster every 10 years. Zoster vaccine. You may need this after age 72. Pneumococcal 13-valent conjugate (PCV13) vaccine. One dose is recommended after age 70. Pneumococcal polysaccharide (PPSV23) vaccine. One dose is recommended after age 42. Talk to your health care provider about which screenings and vaccines you need and how often you need them. This information is not intended to replace advice given to you by your health care provider. Make sure you discuss any questions you have with your health care provider. Document Released: 02/04/2015 Document Revised: 09/28/2015 Document Reviewed: 11/09/2014 Elsevier Interactive Patient Education  2017 [employer] Prevention in the Home Falls can cause injuries. They can happen to people of all ages. There are many things you can do to make your home safe and to help prevent falls. What can I do on the outside of my home? Regularly fix the edges of walkways and driveways and fix any cracks. Remove anything that might make you trip as you walk through a door, such as a raised step or threshold. Trim any bushes or trees on the path to your home. Use bright outdoor lighting. Clear any walking paths of anything that might make someone trip, such as rocks or tools. Regularly check to see if handrails are loose or broken. Make sure that both sides of any steps have handrails. Any raised decks and porches should have guardrails on the edges. Have any leaves, snow, or ice cleared regularly. Use sand or salt on walking paths during winter. Clean up any spills in your garage right away. This includes oil or grease spills. What can I do in the bathroom? Use night lights. Install grab bars by the toilet and in the tub and shower. Do not use towel bars as grab bars. Use non-skid mats or decals in the tub or shower. If you need to sit down in the shower, use a  plastic, non-slip stool. Keep the floor dry. Clean up any water that spills on the floor as soon as it happens. Remove soap buildup in the tub or shower regularly. Attach bath mats securely with double-sided non-slip rug tape. Do not have throw rugs and other things on the floor that can make you trip. What can I do in the bedroom? Use night lights. Make sure that you have a light by your bed that is easy to reach. Do not use any sheets or blankets that are too big for your bed. They should not hang down onto the floor. Have a firm chair that has side arms. You can use this for support while you get dressed. Do not have throw rugs and other things on the floor that can make you trip. What can I do in the kitchen? Clean up any spills right away. Avoid walking on wet floors. Keep items that you use a lot in easy-to-reach places. If you need to reach something above you, use a strong step stool that has a grab bar. Keep electrical cords out of the way. Do not use floor polish or wax that makes floors slippery. If  you must use wax, use non-skid floor wax. Do not have throw rugs and other things on the floor that can make you trip. What can I do with my stairs? Do not leave any items on the stairs. Make sure that there are handrails on both sides of the stairs and use them. Fix handrails that are broken or loose. Make sure that handrails are as long as the stairways. Check any carpeting to make sure that it is firmly attached to the stairs. Fix any carpet that is loose or worn. Avoid having throw rugs at the top or bottom of the stairs. If you do have throw rugs, attach them to the floor with carpet tape. Make sure that you have a light switch at the top of the stairs and the bottom of the stairs. If you do not have them, ask someone to add them for you. What else can I do to help prevent falls? Wear shoes that: Do not have high heels. Have rubber bottoms. Are comfortable and fit you  well. Are closed at the toe. Do not wear sandals. If you use a stepladder: Make sure that it is fully opened. Do not climb a closed stepladder. Make sure that both sides of the stepladder are locked into place. Ask someone to hold it for you, if possible. Clearly mark and make sure that you can see: Any grab bars or handrails. First and last steps. Where the edge of each step is. Use tools that help you move around (mobility aids) if they are needed. These include: Canes. Walkers. Scooters. Crutches. Turn on the lights when you go into a dark area. Replace any light bulbs as soon as they burn out. Set up your furniture so you have a clear path. Avoid moving your furniture around. If any of your floors are uneven, fix them. If there are any pets around you, be aware of where they are. Review your medicines with your doctor. Some medicines can make you feel dizzy. This can increase your chance of falling. Ask your doctor what other things that you can do to help prevent falls. This information is not intended to replace advice given to you by your health care provider. Make sure you discuss any questions you have with your health care provider. Document Released: 11/04/2008 Document Revised: 06/16/2015 Document Reviewed: 02/12/2014 Elsevier Interactive Patient Education  2017 [employer].

## 2022-03-02 NOTE — Progress Notes (Addendum)
I connected with  Desiree Carpenter on 03/02/22 by a audio enabled telemedicine application and verified that I am speaking with the correct person using two identifiers.  Patient Location: Home  Provider Location: Office/Clinic  I discussed the limitations of evaluation and management by telemedicine. The patient expressed understanding and agreed to proceed.  Subjective:   Desiree Carpenter is a 72 y.o. female who presents for Medicare Annual (Subsequent) preventive examination.  Review of Systems     Cardiac Risk Factors include: dyslipidemia;advanced age (>15mn, >>4women);obesity (BMI >30kg/m2)     Objective:    Today's Vitals   03/02/22 1416 03/02/22 1418  Weight: 185 lb (83.9 kg)   Height: '5\' 3"'$  (1.6 m)   PainSc:  1    Body mass index is 32.77 kg/m.     03/02/2022    2:32 PM 02/23/2021   11:01 AM 02/23/2020   10:45 AM 02/19/2019   10:49 AM 10/02/2018    5:12 PM 03/31/2018    9:58 AM 03/21/2018    7:40 AM  Advanced Directives  Does Patient Have a Medical Advance Directive? No No No No No No No  Does patient want to make changes to medical advance directive?    No - Patient declined     Would patient like information on creating a medical advance directive? Yes (ED - Information included in AVS) No - Patient declined Yes (MAU/Ambulatory/Procedural Areas - Information given)  No - Patient declined No - Patient declined No - Patient declined    Current Medications (verified) Outpatient Encounter Medications as of 03/02/2022  Medication Sig   Ascorbic Acid (VITAMIN C PO) Take by mouth daily.   aspirin EC 81 MG tablet Take 1 tablet (81 mg total) daily by mouth.   Multiple Vitamin (MULTIVITAMIN) tablet Take 1 tablet by mouth daily.   pravastatin (PRAVACHOL) 20 MG tablet TAKE 1 TABLET (20 MG TOTAL) BY MOUTH DAILY. AT BEDTIME   No facility-administered encounter medications on file as of 03/02/2022.    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  Service: Gastroenterology;  Laterality: N/A;   COLONOSCOPY WITH PROPOFOL N/A 03/21/2018   Procedure: COLONOSCOPY WITH BIOPSY;  Surgeon: WLucilla Lame MD;  [place]  Service: [place]oscopy;  Laterality: N/A;   Family History  Problem Relation Age of Onset   Cancer Mother        breast (538, colon ((12   Breast cancer Mother 576  Cancer Father        colon   Cancer Maternal Grandmother        breast   Breast cancer Maternal Grandmother 52   Heart disease Maternal Grandfather        heart failure   Cancer Maternal Grandfather        lung   Stroke Paternal Grandmother    Social History   Socioeconomic History   Marital status: Married    Spouse name: Not on file   Number of children: 1   Years of education: Not on file  Highest education level: Bachelor's degree (e.g., BA, AB, BS)  Occupational History   Occupation: retired  Tobacco Use   Smoking status: Never   Smokeless tobacco: Never  Vaping Use   Vaping Use: Never used  Substance and Sexual Activity   Alcohol use: No   Drug use: No   Sexual activity: Not Currently  Other Topics Concern   Not on file  Social History Narrative   Pt has one child who lives in [place] and her mother recently came to live with her   Social Determinants of Health   Financial Resource Strain: Low Risk  (03/02/2022)   Overall Financial Resource Strain (CARDIA)    Difficulty of Paying Living Expenses: Not hard at all  Food Insecurity: No Food Insecurity (03/02/2022)   Hunger Vital Sign    Worried About  Running Out of Food in the Last Year: Never true    [place] in the Last Year: Never true  Transportation Needs: No Transportation Needs (03/02/2022)   PRAPARE - Hydrologist (Medical): No    Lack of Transportation (Non-Medical): No  Physical Activity: Insufficiently Active (02/23/2021)   Exercise Vital Sign    Days of Exercise per Week: 2 days    Minutes of Exercise per Session: 30 min  Stress: No Stress Concern Present (03/02/2022)   [place]    Feeling of Stress : Not at all  Social Connections: [place] (03/02/2022)   Social Connection and Isolation Panel [NHANES]    Frequency of Communication with Friends and Family: More than three times a week    Frequency of Social Gatherings with Friends and Family: More than three times a week    Attends Religious Services: More than 4 times per year    Active Member of [employer] or Organizations: Yes    Attends Music therapist: More than 4 times per year    Marital Status: Married    Tobacco Counseling Counseling given: Not Answered   Clinical Intake:  Pre-visit preparation completed: Yes  Pain : 0-10 Pain Score: 1  Pain Type: Chronic pain Pain Location: Knee (right) Pain Orientation: Right Pain Descriptors / Indicators: Aching Pain Onset: Other (comment) Pain Frequency: Intermittent Pain Relieving Factors: tylenol, voltaren, heat, exercise  Pain Relieving Factors: tylenol, voltaren, heat, exercise  BMI - recorded: 32.77 Nutritional Status: BMI > 30  Obese Nutritional Risks: None Diabetes: No  How often do you need to have someone help you when you read instructions, pamphlets, or other written materials from your doctor or pharmacy?: 1 - Never  Diabetic?no  Interpreter Needed?: No  Information entered by :: B.Adelyne Marchese,LPN   Activities of Daily Living    03/02/2022    2:36 PM  In your present state  of health, do you have any difficulty performing the following activities:  Hearing? 0  Vision? 0  Difficulty concentrating or making decisions? 0  Walking or climbing stairs? 0  Dressing or bathing? 0  Doing errands, shopping? 0  Preparing Food and eating ? N  Using the Toilet? N  In the past six months, have you accidently leaked urine? N  Do you have problems with loss of bowel control? N  Managing your Medications? N  Managing your Finances? N  Housekeeping or managing your Housekeeping? N    Patient Care Team: Delsa Grana, PA-C as PCP - General (Family Medicine) Anell Barr, OD [facility])  Indicate any recent  Medical Services you may have received from other than [facility] providers in the past year (date may be approximate).     Assessment:   This is a routine wellness examination for [place].  Hearing/Vision screen Hearing Screening - Comments:: Adequate hearing Vision Screening - Comments:: Adequate vision;just readers;Dr Ellin Mayhew  Dietary issues and exercise activities discussed: Current Exercise Habits: Structured exercise class, Type of exercise: walking, Time (Minutes): 60, Frequency (Times/Week): 2, Weekly Exercise (Minutes/Week): 120, Intensity: Mild, Exercise limited by: None identified   Goals Addressed   None    Depression Screen    03/02/2022    2:23 PM 02/23/2021   11:00 AM 02/22/2021   10:15 AM 02/23/2020   11:33 AM 02/23/2020   10:44 AM 01/06/2020   10:47 AM 03/10/2019   11:58 AM  PHQ 2/9 Scores  PHQ - 2 Score 0 0 0 0 0 0 0  PHQ- 9 Score  0 0 0   0    Fall Risk    03/02/2022    2:21 PM 02/23/2021   11:02 AM 02/22/2021   10:15 AM 09/14/2020    1:33 PM 02/23/2020   11:33 AM  [place] in the past year? 0 0 0 0 0  Number falls in past yr: 0 0 0 0 0  Injury with Fall? 0 0 0  0  Risk for fall due to : No Fall Risks No Fall Risks No Fall Risks    Follow up Education provided;Falls prevention discussed Falls prevention discussed Falls prevention  discussed      FALL RISK PREVENTION PERTAINING TO THE HOME:  Any stairs in or around the home? Yes outside If so, are there any without handrails? Yes  Home free of loose throw rugs in walkways, pet beds, electrical cords, etc? Yes  Adequate lighting in your home to reduce risk of falls? Yes   ASSISTIVE DEVICES UTILIZED TO PREVENT FALLS:  Life alert? No  Use of a cane, walker or w/c? No  Grab bars in the bathroom? Yes  Shower chair or bench in shower? No  Elevated toilet seat or a handicapped toilet? No    Cognitive Function:        03/02/2022    2:32 PM 02/19/2019   10:53 AM 02/14/2018    2:56 PM  6CIT Screen  What Year? 0 points 0 points 0 points  What month? 0 points 0 points 0 points  What time? 0 points 0 points 0 points  Count back from 20 0 points 0 points 0 points  Months in reverse 0 points 0 points 0 points  Repeat phrase 0 points 0 points 0 points  Total Score 0 points 0 points 0 points    Immunizations Immunization History  Administered Date(s) Administered   Fluad Quad(high Dose 65+) 10/27/2018   Influenza, High Dose Seasonal PF 10/11/2017, 11/18/2019   Influenza,inj,Quad PF,6+ Mos 10/19/2014   Influenza-Unspecified 10/11/2017, 11/08/2020   Moderna Sars-Covid-2 Vaccination 03/21/2019, 04/18/2019   Pneumococcal Conjugate-13 05/06/2017   Pneumococcal Polysaccharide-23 09/01/2018   Tdap 10/05/2010    TDAP status: Up to date  Flu Vaccine status: Up to date  Pneumococcal vaccine status: Up to date  Covid-19 vaccine status: Completed vaccines  Qualifies for Shingles Vaccine? Yes   Zostavax completed No   Shingrix Completed?: No.    Education has been provided regarding the importance of this vaccine. Patient has been advised to call insurance company to determine out of pocket expense if they have not yet received  this vaccine. Advised may also receive vaccine at local pharmacy or Health Dept. Verbalized acceptance and understanding.  Screening  Tests Health Maintenance  Topic Date Due   Zoster Vaccines- Shingrix (1 of 2) Never done   DEXA SCAN  01/30/2014   DTaP/Tdap/Td (2 - Td or Tdap) 10/04/2020   INFLUENZA VACCINE  08/22/2021   COVID-19 Vaccine (3 - 2023-24 season) 09/22/2021   MAMMOGRAM  11/01/2022   Medicare Annual Wellness (AWV)  03/03/2023   COLONOSCOPY (Pts 45-55yr Insurance coverage will need to be confirmed)  03/22/2023   Pneumonia Vaccine 72 Years old  Completed   Hepatitis C Screening  Completed   HPV VACCINES  Aged Out    Health Maintenance  Health Maintenance Due  Topic Date Due   Zoster Vaccines- Shingrix (1 of 2) Never done   DEXA SCAN  01/30/2014   DTaP/Tdap/Td (2 - Td or Tdap) 10/04/2020   INFLUENZA VACCINE  08/22/2021   COVID-19 Vaccine (3 - 2023-24 season) 09/22/2021    Colorectal cancer screening: Type of screening: Colonoscopy. Completed yes. Repeat every 5 years  Mammogram status: Ordered yes. Pt provided with contact info and advised to call to schedule appt. Pt will schedule after 11/01/22  Bone Density status: Ordered yes. Pt provided with contact info and advised to call to schedule appt.pt will call for appt  Lung Cancer Screening: (Low Dose CT Chest recommended if Age 71-80years, 30 pack-year currently smoking OR have quit w/in 15years.) does not qualify.   Lung Cancer Screening Referral: no  Additional Screening:  Hepatitis C Screening: does not qualify; Completed no  Vision Screening: Recommended annual ophthalmology exams for early detection of glaucoma and other disorders of the eye. Is the patient up to date with their annual eye exam?  Yes  Who is the provider or what is the name of the office in which the patient attends annual eye exams? Dr WEllin MayhewIf pt is not established with a provider, would they like to be referred to a provider to establish care? No .   Dental Screening: Recommended annual dental exams for proper oral hygiene  Community Resource Referral / Chronic  Care Management: CRR required this visit?  No   CCM required this visit?  No      Plan:     I have personally reviewed and noted the following in the patient's chart:   Medical and social history Use of alcohol, tobacco or illicit drugs  Current medications and supplements including opioid prescriptions. Patient is not currently taking opioid prescriptions. Functional ability and status Nutritional status Physical activity Advanced directives List of other physicians Hospitalizations, surgeries, and ER visits in previous 12 months Vitals Screenings to include cognitive, depression, and falls Referrals and appointments  In addition, I have reviewed and discussed with patient certain preventive protocols, quality metrics, and best practice recommendations. A written personalized care plan for preventive services as well as general preventive health recommendations were provided to patient.     BRoger Shelter LPN   [id number]  Nurse Notes: pt is doing well with no concerns or questions.

## 2022-03-20 ENCOUNTER — Encounter: Payer: Self-pay | Admitting: Family Medicine

## 2022-03-20 ENCOUNTER — Ambulatory Visit (INDEPENDENT_AMBULATORY_CARE_PROVIDER_SITE_OTHER): Payer: Medicare HMO | Admitting: Family Medicine

## 2022-03-20 VITALS — BP 122/76 | HR 72 | Temp 97.9°F | Resp 16 | Ht 63.0 in | Wt 182.1 lb

## 2022-03-20 DIAGNOSIS — Z800 Family history of malignant neoplasm of digestive organs: Secondary | ICD-10-CM

## 2022-03-20 DIAGNOSIS — R1030 Lower abdominal pain, unspecified: Secondary | ICD-10-CM

## 2022-03-20 DIAGNOSIS — M549 Dorsalgia, unspecified: Secondary | ICD-10-CM | POA: Diagnosis not present

## 2022-03-20 DIAGNOSIS — M170 Bilateral primary osteoarthritis of knee: Secondary | ICD-10-CM | POA: Diagnosis not present

## 2022-03-20 DIAGNOSIS — M79604 Pain in right leg: Secondary | ICD-10-CM

## 2022-03-20 DIAGNOSIS — D581 Hereditary elliptocytosis: Secondary | ICD-10-CM | POA: Diagnosis not present

## 2022-03-20 DIAGNOSIS — E6609 Other obesity due to excess calories: Secondary | ICD-10-CM

## 2022-03-20 DIAGNOSIS — E785 Hyperlipidemia, unspecified: Secondary | ICD-10-CM

## 2022-03-20 DIAGNOSIS — Z5181 Encounter for therapeutic drug level monitoring: Secondary | ICD-10-CM | POA: Diagnosis not present

## 2022-03-20 NOTE — Progress Notes (Unsigned)
Patient ID: Desiree Carpenter, female    DOB: [birth year], 72 y.o.   MRN: [id number]  PCP: Delsa Grana, PA-C  Chief Complaint  Patient presents with   Leg Pain    Right leg, onset for close to a month   Groin Pain    Subjective:   Desiree Carpenter is a 72 y.o. female, presents to clinic with CC of the following:  HPI  Presents for right leg, groin and low back pain that comes and goes  Hx of back pain coming and going - was really bad about a month ago after some strenuous activity Usually only is bothersome for up to a week , usually managed with stretching and Xstrength tylenol, walking and staying moving, pain down the leg sometimes feelsl ike a throbbing Groin pain also similar quality No pain today but it has made her concerned about getting checked for pancreatic CA She has not had any nausea, dysphagia, weight loss, abdominal pain, jaundice, change in bowels  She is concerned due to her family history-  Mom pancreatic CA diagnosed late 70's Colon cancer early 70's Dad's colon CA early 70's  Weight has been relatively stable over the past several years Wt Readings from Last 5 Encounters:  03/20/22 182 lb 1.6 oz (82.6 kg)  03/02/22 185 lb (83.9 kg)  02/22/21 185 lb (83.9 kg)  02/23/20 186 lb 3.2 oz (84.5 kg)  01/06/20 187 lb 12.8 oz (85.2 kg)   BMI Readings from Last 5 Encounters:  03/20/22 32.26 kg/m  03/02/22 32.77 kg/m  02/22/21 32.77 kg/m  02/23/20 32.98 kg/m  01/06/20 33.27 kg/m   Pravastatin daily, good compliance - forgets occasionally - not often She denies any side effects or concerns with taking the medication though she previously did not tolerate atorvastatin she is due for labs Lab Results  Component Value Date   CHOL 155 02/22/2021   HDL 70 02/22/2021   LDLCALC 68 02/22/2021   TRIG 85 02/22/2021   CHOLHDL 2.2 02/22/2021     Patient Active Problem List   Diagnosis Date Noted   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 AT BEDTIME, Disp: 90 tablet, Rfl: 3   Allergies  Allergen Reactions   Atorvastatin     Constipation     Social History   Tobacco Use   Smoking status: Never   Smokeless tobacco: Never  Vaping Use   Vaping Use: Never used  Substance Use Topics   Alcohol use: No   Drug use: No      Chart Review Today: I have reviewed the patient's medical history in detail and updated the computerized patient record.   Review of Systems  Constitutional: Negative.   HENT: Negative.    Eyes: Negative.   Respiratory: Negative.    Cardiovascular: Negative.   Gastrointestinal: Negative.   Endocrine: Negative.   Genitourinary: Negative.   Musculoskeletal: Negative.   Skin: Negative.   Allergic/Immunologic: Negative.  Neurological: Negative.   Hematological: Negative.   Psychiatric/Behavioral: Negative.    All other systems reviewed and are negative.      Objective:   Vitals:   03/20/22 1010  BP: 122/76  Pulse: 72  Resp: 16  Temp: 97.9 F (36.6 C)  TempSrc: Oral  SpO2: 99%  Weight: 182 lb 1.6 oz (82.6 kg)  Height: '5\' 3"'$  (1.6 m)    Body mass index is 32.26 kg/m.  Physical Exam Vitals and nursing note reviewed.  Constitutional:      General: She is not in acute distress.    Appearance: Normal appearance. She is well-developed, well-groomed and overweight. She is not ill-appearing, toxic-appearing or diaphoretic.  HENT:     Head: Normocephalic and atraumatic.     Right Ear:  External ear normal.     Left Ear: External ear normal.  Eyes:     General: Lids are normal. No scleral icterus.       Right eye: No discharge.        Left eye: No discharge.     Conjunctiva/sclera: Conjunctivae normal.  Neck:     Trachea: Phonation normal. No tracheal deviation.  Cardiovascular:     Rate and Rhythm: Normal rate and regular rhythm.     Pulses: Normal pulses.          Radial pulses are 2+ on the right side and 2+ on the left side.       Posterior tibial pulses are 2+ on the right side and 2+ on the left side.     Heart sounds: Normal heart sounds. No murmur heard.    No friction rub. No gallop.  Pulmonary:     Effort: Pulmonary effort is normal. No respiratory distress.     Breath sounds: Normal breath sounds. No stridor. No wheezing, rhonchi or rales.  Chest:     Chest wall: No tenderness.  Abdominal:     General: Bowel sounds are normal. There is no distension.     Palpations: Abdomen is soft. There is no hepatomegaly, splenomegaly, mass or pulsatile mass.     Tenderness: There is no abdominal tenderness. There is no right CVA tenderness, left CVA tenderness, guarding or rebound.  Musculoskeletal:     Cervical back: Normal range of motion.     Thoracic back: Normal. No tenderness or bony tenderness. Normal range of motion.     Lumbar back: Normal. No tenderness or bony tenderness. Normal range of motion. Negative right straight leg raise test and negative left straight leg raise test.     Right hip: Normal. No tenderness, bony tenderness or crepitus. Normal range of motion.     Right lower leg: Normal. No swelling. No edema.     Left lower leg: No swelling. No edema.  Skin:    General: Skin is warm and dry.     Capillary Refill: Capillary refill takes less than 2 seconds.     Coloration: Skin is not jaundiced or pale.     Findings: No bruising or rash.  Neurological:     Mental Status: She is alert. Mental status is at baseline.     Motor: No abnormal muscle  tone.     Gait: Gait normal.  Psychiatric:        Mood and Affect: Mood normal.        Speech: Speech normal.        Behavior: Behavior normal. Behavior is cooperative.      Results for orders placed or  performed in visit on 02/22/21  Lipid panel  Result Value Ref Range   Cholesterol 155 <200 mg/dL   HDL 70 > OR = 50 mg/dL   Triglycerides 85 <150 mg/dL   LDL Cholesterol (Calc) 68 mg/dL (calc)   Total CHOL/HDL Ratio 2.2 <5.0 (calc)   Non-HDL Cholesterol (Calc) 85 <130 mg/dL (calc)  COMPLETE METABOLIC PANEL WITH GFR  Result Value Ref Range   Glucose, Bld 92 65 - 99 mg/dL   BUN 20 7 - 25 mg/dL   Creat 0.83 0.50 - 1.05 mg/dL   eGFR 76 > OR = 60 mL/min/1.66m   BUN/Creatinine Ratio NOT APPLICABLE 6 - 22 (calc)   Sodium 141 135 - 146 mmol/L   Potassium 4.8 3.5 - 5.3 mmol/L   Chloride 106 98 - 110 mmol/L   CO2 28 20 - 32 mmol/L   Calcium 9.8 8.6 - 10.4 mg/dL   Total Protein 6.9 6.1 - 8.1 g/dL   Albumin 4.4 3.6 - 5.1 g/dL   Globulin 2.5 1.9 - 3.7 g/dL (calc)   AG Ratio 1.8 1.0 - 2.5 (calc)   Total Bilirubin 0.5 0.2 - 1.2 mg/dL   Alkaline phosphatase (APISO) 59 37 - 153 U/L   AST 17 10 - 35 U/L   ALT 16 6 - 29 U/L  CBC with Differential/Platelet  Result Value Ref Range   WBC 4.6 3.8 - 10.8 Thousand/uL   RBC 4.67 3.80 - 5.10 Million/uL   Hemoglobin 13.1 11.7 - 15.5 g/dL   HCT 40.5 35.0 - 45.0 %   MCV 86.7 80.0 - 100.0 fL   MCH 28.1 27.0 - 33.0 pg   MCHC 32.3 32.0 - 36.0 g/dL   RDW 12.2 11.0 - 15.0 %   Platelets 186 140 - 400 Thousand/uL   MPV 13.7 (H) 7.5 - 12.5 fL   Neutro Abs 2,893 1,500 - 7,800 cells/uL   Lymphs Abs 1,283 850 - 3,900 cells/uL   Absolute Monocytes 322 200 - 950 cells/uL   Eosinophils Absolute 92 15 - 500 cells/uL   Basophils Absolute 9 0 - 200 cells/uL   Neutrophils Relative % 62.9 %   Total Lymphocyte 27.9 %   Monocytes Relative 7.0 %   Eosinophils Relative 2.0 %   Basophils Relative 0.2 %       Assessment & Plan:   Problem List Items  Addressed This Visit       Musculoskeletal and Integument   Arthritis of both knees (Chronic)    Chronic, sx stable, no effusion/edema She is having right low back and hip/groin pain - may also be arthritic?         Other   Mild hyperlipidemia    She comes in today for back hip and groin pain concerns but she is overdue for routine follow-up and labs She manages hyperlipidemia with pravastatin 20 mg daily she occasionally misses a dose, she has not had any side effects or concerns with the medication Due for recheck of labs today      Relevant Medications   pravastatin (PRAVACHOL) 20 MG tablet   Other Relevant Orders   COMPLETE METABOLIC PANEL WITH GFR (Completed)   Lipid panel (Completed)   Hereditary elliptocytosis ([place]    Labs have been stable we have been monitoring, recheck CBC      Other Visit Diagnoses     Right leg pain    -  Primary   likely referred low back pain/sciatica, not symptomatic today, neg SLR   Inguinal pain,  unspecified laterality       improves with walking or bearing weight, if it returns may want to consider getting dedicated hip Xray   Family history of colon cancer in father       Relevant Orders   Ambulatory referral to Genetics   Family history of colon cancer in mother       Relevant Orders   Ambulatory referral to Genetics   Family history of pancreatic cancer       She is concerned with back pain that it may be related to her family history, abd exam benign, no sx concerning for pancreatic mass   Relevant Orders   Ambulatory referral to Genetics   Back pain, unspecified back location, unspecified back pain laterality, unspecified chronicity       onset 2 weeks ago after strenuous activity, likely low back strain, recommended PT consult if she repeatedly experienced low back pain/sciatica   Relevant Orders   Ambulatory referral to Genetics   Encounter for medication monitoring       Relevant Orders   CBC with Differential/Platelet  (Completed)   COMPLETE METABOLIC PANEL WITH GFR (Completed)   Lipid panel (Completed)       We discussed pancreatic cancers in her family history at length today, furthermore discussed how there is no standard screening test for pancreatic cancer however she may consult with genetics to see if she is a candidate for any genetic testing. We discussed often the late presenting symptoms of pancreatic or upper GI malignancies which tend to include decreased appetite, dysphagia, nausea vomiting, weight loss, jaundice -I explained to her that I will be checking her liver enzymes and alk phos today which sometimes can be related to pancreatic issues.  She has no history of chronic or acute pancreatitis.  Her back pain recently occurred after a lot of strenuous activity and lifting as much more likely that it is a low back strain with possibly some sciatic features that have largely resolved prior to today I have been unable to reproduce any pain, and I think it is very unlikely that her back hip or groin pain is a symptom of a mass.  She is well-appearing, weights are stable, abdominal exam is benign, no lymph adenopathy or jaundice.  Return in about 6 months (around 09/18/2022) for Routine follow-up.   Delsa Grana, PA-C 03/20/22 10:35 AM

## 2022-03-21 LAB — COMPLETE METABOLIC PANEL WITH GFR
AG Ratio: 1.7 (calc) (ref 1.0–2.5)
ALT: 12 U/L (ref 6–29)
AST: 18 U/L (ref 10–35)
Albumin: 4.5 g/dL (ref 3.6–5.1)
Alkaline phosphatase (APISO): 59 U/L (ref 37–153)
BUN: 17 mg/dL (ref 7–25)
CO2: 24 mmol/L (ref 20–32)
Calcium: 10.3 mg/dL (ref 8.6–10.4)
Chloride: 107 mmol/L (ref 98–110)
Creat: 0.82 mg/dL (ref 0.60–1.00)
Globulin: 2.6 g/dL (calc) (ref 1.9–3.7)
Glucose, Bld: 76 mg/dL (ref 65–99)
Potassium: 4.7 mmol/L (ref 3.5–5.3)
Sodium: 143 mmol/L (ref 135–146)
Total Bilirubin: 0.4 mg/dL (ref 0.2–1.2)
Total Protein: 7.1 g/dL (ref 6.1–8.1)
eGFR: 77 mL/min/{1.73_m2} (ref >=60)

## 2022-03-21 LAB — LIPID PANEL
Cholesterol: 145 mg/dL (ref <200)
HDL: 73 mg/dL (ref >=50)
LDL Cholesterol (Calc): 55 mg/dL (calc)
Non-HDL Cholesterol (Calc): 72 mg/dL (calc) (ref <130)
Total CHOL/HDL Ratio: 2 (calc) (ref <5.0)
Triglycerides: 88 mg/dL (ref <150)

## 2022-03-21 LAB — CBC WITH DIFFERENTIAL/PLATELET
Absolute Monocytes: 391 cells/uL (ref 200–950)
Basophils Absolute: 18 cells/uL (ref 0–200)
Basophils Relative: 0.4 %
Eosinophils Absolute: 60 cells/uL (ref 15–500)
Eosinophils Relative: 1.3 %
HCT: 40.9 % (ref 35.0–45.0)
Hemoglobin: 13.1 g/dL (ref 11.7–15.5)
Lymphs Abs: 1435 cells/uL (ref 850–3900)
MCH: 27.8 pg (ref 27.0–33.0)
MCHC: 32 g/dL (ref 32.0–36.0)
MCV: 86.8 fL (ref 80.0–100.0)
MPV: 12.7 fL — ABNORMAL HIGH (ref 7.5–12.5)
Monocytes Relative: 8.5 %
Neutro Abs: 2696 cells/uL (ref 1500–7800)
Neutrophils Relative %: 58.6 %
Platelets: 206 10*3/uL (ref 140–400)
RBC: 4.71 10*6/uL (ref 3.80–5.10)
RDW: 12.2 % (ref 11.0–15.0)
Total Lymphocyte: 31.2 %
WBC: 4.6 10*3/uL (ref 3.8–10.8)

## 2022-03-22 MED ORDER — PRAVASTATIN SODIUM 20 MG PO TABS: 20.0000 mg | ORAL_TABLET | Freq: Every day | ORAL | 3 refills | Status: DC

## 2022-03-22 NOTE — Assessment & Plan Note (Signed)
She comes in today for back hip and groin pain concerns but she is overdue for routine follow-up and labs She manages hyperlipidemia with pravastatin 20 mg daily she occasionally misses a dose, she has not had any side effects or concerns with the medication Due for recheck of labs today

## 2022-03-22 NOTE — Assessment & Plan Note (Signed)
Labs have been stable we have been monitoring, recheck CBC

## 2022-03-22 NOTE — Assessment & Plan Note (Signed)
Chronic, sx stable, no effusion/edema She is having right low back and hip/groin pain - may also be arthritic?

## 2022-04-17 ENCOUNTER — Encounter: Payer: Medicare HMO | Admitting: Licensed Clinical Social Worker

## 2022-04-17 ENCOUNTER — Other Ambulatory Visit: Payer: Medicare HMO

## 2022-06-08 ENCOUNTER — Ambulatory Visit
Admission: RE | Admit: 2022-06-08 | Discharge: 2022-06-08 | Disposition: A | Discharge status: Home / Self Care | Payer: Medicare HMO | Source: Ambulatory Visit | Attending: Family Medicine | Admitting: Family Medicine

## 2022-06-08 DIAGNOSIS — M8589 Other specified disorders of bone density and structure, multiple sites: Secondary | ICD-10-CM | POA: Diagnosis not present

## 2022-06-08 DIAGNOSIS — Z13820 Encounter for screening for osteoporosis: Secondary | ICD-10-CM

## 2022-06-08 DIAGNOSIS — Z780 Asymptomatic menopausal state: Secondary | ICD-10-CM | POA: Insufficient documentation

## 2022-08-16 ENCOUNTER — Telehealth: Payer: Self-pay | Admitting: *Deleted

## 2022-08-16 NOTE — Telephone Encounter (Signed)
I attempted to contact patient by telephone but was unsuccessful. According to the patient's chart they are due for follow up 8/27 with [facility]. I have left a HIPAA compliant message advising the patient to contact [facility] at [phone]. I will continue to follow up with the patient to make sure this appointment is scheduled.

## 2022-08-20 ENCOUNTER — Encounter: Payer: Self-pay | Admitting: *Deleted

## 2022-08-20 NOTE — Telephone Encounter (Signed)
I attempted to contact patient by telephone but was unsuccessful. According to the patient's chart they are due for follow up 8/27 with [facility]. I have left a HIPAA compliant message advising the patient to contact [facility] at [phone]. I will continue to follow up with the patient to make sure this appointment is scheduled.

## 2022-10-24 ENCOUNTER — Other Ambulatory Visit: Payer: Self-pay | Admitting: Family Medicine

## 2022-10-24 DIAGNOSIS — Z1231 Encounter for screening mammogram for malignant neoplasm of breast: Secondary | ICD-10-CM

## 2022-11-08 ENCOUNTER — Ambulatory Visit
Admission: RE | Admit: 2022-11-08 | Discharge: 2022-11-08 | Disposition: A | Discharge status: Home / Self Care | Payer: Medicare HMO | Source: Ambulatory Visit | Attending: Family Medicine | Admitting: Family Medicine

## 2022-11-08 DIAGNOSIS — Z1231 Encounter for screening mammogram for malignant neoplasm of breast: Secondary | ICD-10-CM | POA: Diagnosis not present

## 2023-03-14 ENCOUNTER — Ambulatory Visit: Payer: Medicare HMO

## 2023-03-14 DIAGNOSIS — Z0000 Encounter for general adult medical examination without abnormal findings: Secondary | ICD-10-CM

## 2023-03-14 DIAGNOSIS — Z1211 Encounter for screening for malignant neoplasm of colon: Secondary | ICD-10-CM

## 2023-03-14 NOTE — Patient Instructions (Signed)
 Desiree Carpenter , Thank you for taking time to come for your Medicare Wellness Visit. I appreciate your ongoing commitment to your health goals. Please review the following plan we discussed and let me know if I can assist you in the future.   Referrals/Orders/Follow-Ups/Clinician Recommendations: REFERRAL SENT FOR COLONSCOPY   This is a list of the screening recommended for you and due dates:  Health Maintenance  Topic Date Due   Zoster (Shingles) Vaccine (1 of 2) Never done   DTaP/Tdap/Td vaccine (2 - Td or Tdap) 10/04/2020   COVID-19 Vaccine (3 - 2024-25 season) 09/23/2022   Colon Cancer Screening  03/22/2023   Mammogram  11/08/2023   Medicare Annual Wellness Visit  03/13/2024   DEXA scan (bone density measurement)  06/07/2024   Pneumonia Vaccine  Completed   Flu Shot  Completed   Hepatitis C Screening  Completed   HPV Vaccine  Aged Out    Advanced directives: (ACP Link)Information on Advanced Care Planning can be found at [facility] Advance Health Care Directives Advance Health Care Directives ([email or web address]   Next Medicare Annual Wellness Visit scheduled for next year: Yes  03/19/24 @ 11:30 AM BY PHONE

## 2023-03-14 NOTE — Progress Notes (Signed)
 Subjective:   Desiree Carpenter is a 73 y.o. female who presents for Medicare Annual (Subsequent) preventive examination.  Visit Complete: Virtual I connected with  Elyse Hsu on 03/14/23 by a audio enabled telemedicine application and verified that I am speaking with the correct person using two identifiers.  This patient declined Interactive audio and Acupuncturist. Therefore the visit was completed with audio only.   Patient Location: Home  Provider Location: Office/Clinic  I discussed the limitations of evaluation and management by telemedicine. The patient expressed understanding and agreed to proceed.  Vital Signs: Because this visit was a virtual/telehealth visit, some criteria may be missing or patient reported. Any vitals not documented were not able to be obtained and vitals that have been documented are patient reported.  Cardiac Risk Factors include: advanced age (>31men, >83 women);dyslipidemia;obesity (BMI >30kg/m2)     Objective:    There were no vitals filed for this visit. There is no height or weight on file to calculate BMI.     03/14/2023    1:57 PM 03/02/2022    2:32 PM 02/23/2021   11:01 AM 02/23/2020   10:45 AM 02/19/2019   10:49 AM 10/02/2018    5:12 PM 03/31/2018    9:58 AM  Advanced Directives  Does Patient Have a Medical Advance Directive? No No No No No No No  Does patient want to make changes to medical advance directive?     No - Patient declined    Would patient like information on creating a medical advance directive? No - Patient declined Yes (ED - Information included in AVS) No - Patient declined Yes (MAU/Ambulatory/Procedural Areas - Information given)  No - Patient declined No - Patient declined    Current Medications (verified) Outpatient Encounter Medications as of 03/14/2023  Medication Sig   Ascorbic Acid (VITAMIN C PO) Take by mouth daily.   aspirin EC 81 MG tablet Take 1 tablet (81 mg total) daily by mouth.   Multiple  Vitamin (MULTIVITAMIN) tablet Take 1 tablet by mouth daily.   pravastatin (PRAVACHOL) 20 MG tablet Take 1 tablet (20 mg total) by mouth daily. At bedtime   No facility-administered encounter medications on file as of 03/14/2023.    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   Mild hyperlipidemia 02/27/2018   Obesity 03/28/2016   Past Surgical History:  Procedure Laterality Date   ABDOMINAL HYSTERECTOMY     BUNIONECTOMY     COLONOSCOPY WITH PROPOFOL N/A 02/28/2017   Procedure: COLONOSCOPY WITH PROPOFOL;  Surgeon: Wyline Mood, MD;  Location: [facility] END[facility];  Service: Gastroenterology;  Laterality: N/A;   COLONOSCOPY WITH PROPOFOL N/A 03/21/2018   Procedure: COLONOSCOPY WITH BIOPSY;  Surgeon: Midge Minium, MD;  Location: [facility];  Service: Endoscopy;  Laterality: N/A;   Family History  Problem Relation Age of Onset   Cancer Mother        breast (50), colon (89)   Breast cancer Mother 36   Cancer Father 52       colon   Cancer Maternal Grandmother        breast   Breast cancer Maternal Grandmother 52   Heart disease Maternal Grandfather        heart failure   Cancer Maternal Grandfather        lung   Stroke Paternal Grandmother  Social History   Socioeconomic History   Marital status: Married    Spouse name: Not on file   Number of children: 1   Years of education: Not on file   Highest education level: Bachelor's degree (e.g., BA, AB, BS)  Occupational History   Occupation: retired  Tobacco Use   Smoking status: Never   Smokeless tobacco: Never  Vaping Use   Vaping status: Never Used  Substance and Sexual Activity   Alcohol use: No   Drug use: No   Sexual activity: Not Currently  Other Topics Concern   Not on file  Social History Narrative   Pt has one child who lives in [place] and her mother  recently came to live with her   Social Drivers of Corporate investment banker Strain: Low Risk  (03/14/2023)   Overall Financial Resource Strain (CARDIA)    Difficulty of Paying Living Expenses: Not hard at all  Food Insecurity: No Food Insecurity (03/14/2023)   Hunger Vital Sign    Worried About Running Out of Food in the Last Year: Never true    Ran Out of Food in the Last Year: Never true  Transportation Needs: No Transportation Needs (03/14/2023)   PRAPARE - Administrator, Civil Service (Medical): No    Lack of Transportation (Non-Medical): No  Physical Activity: Insufficiently Active (03/14/2023)   Exercise Vital Sign    Days of Exercise per Week: 3 days    Minutes of Exercise per Session: 30 min  Stress: No Stress Concern Present (03/14/2023)   [employer] of Occupational Health - Occupational Stress Questionnaire    Feeling of Stress : Not at all  Social Connections: Socially Integrated (03/14/2023)   Social Connection and Isolation Panel [NHANES]    Frequency of Communication with Friends and Family: More than three times a week    Frequency of Social Gatherings with Friends and Family: Once a week    Attends Religious Services: More than 4 times per year    Active Member of [employer] or Organizations: Yes    Attends Engineer, structural: More than 4 times per year    Marital Status: Married    Tobacco Counseling Counseling given: Not Answered   Clinical Intake:  Pre-visit preparation completed: Yes  Pain : No/denies pain     BMI - recorded: 32.2 Nutritional Status: BMI > 30  Obese Nutritional Risks: None Diabetes: No  How often do you need to have someone help you when you read instructions, pamphlets, or other written materials from your doctor or pharmacy?: 1 - Never  Interpreter Needed?: No  Information entered by :: Kennedy Bucker, LPN   Activities of Daily Living    03/14/2023    1:57 PM 03/13/2023    9:21 PM  In your present  state of health, do you have any difficulty performing the following activities:  Hearing? 0 0  Vision? 0 0  Difficulty concentrating or making decisions? 0 0  Walking or climbing stairs? 0 0  Dressing or bathing? 0 0  Doing errands, shopping? 0 0  Preparing Food and eating ? N N  Using the Toilet? N N  In the past six months, have you accidently leaked urine? N N  Do you have problems with loss of bowel control? N N  Managing your Medications? N N  Managing your Finances? N N  Housekeeping or managing your Housekeeping? N N    Patient Care Team: Sander Radon as PCP -  General (Family Medicine) Isla Pence, OD (Optometry)  Indicate any recent Medical Services you may have received from other than [facility] providers in the past year (date may be approximate).     Assessment:   This is a routine wellness examination for [place].  Hearing/Vision screen Hearing Screening - Comments:: NO AIDS Vision Screening - Comments:: [facility]- [facility]   Goals Addressed             This Visit's Progress    DIET - EAT MORE FRUITS AND VEGETABLES         Depression Screen    03/14/2023    1:55 PM 03/20/2022   10:09 AM 03/02/2022    2:23 PM 02/23/2021   11:00 AM 02/22/2021   10:15 AM 02/23/2020   11:33 AM 02/23/2020   10:44 AM  PHQ 2/9 Scores  PHQ - 2 Score 0 0 0 0 0 0 0  PHQ- 9 Score 0 0  0 0 0     Fall Risk    03/14/2023    1:57 PM 03/13/2023    9:21 PM 03/20/2022   10:09 AM 03/02/2022    2:21 PM 02/23/2021   11:02 AM  Fall Risk   Falls in the past year? 1 1 0 0 0  Number falls in past yr: 0 0 0 0 0  Injury with Fall? 0 0 0 0 0  Risk for fall due to :   No Fall Risks No Fall Risks No Fall Risks  Follow up Falls evaluation completed;Falls prevention discussed  Falls prevention discussed;Education provided;Falls evaluation completed Education provided;Falls prevention discussed Falls prevention discussed    MEDICARE RISK AT HOME: Medicare Risk at Home Any stairs in or around the  home?: Yes If so, are there any without handrails?: No Home free of loose throw rugs in walkways, pet beds, electrical cords, etc?: Yes Adequate lighting in your home to reduce risk of falls?: Yes Life alert?: No Use of a cane, walker or w/c?: No Grab bars in the bathroom?: Yes Shower chair or bench in shower?: No Elevated toilet seat or a handicapped toilet?: No  TIMED UP AND GO:  Was the test performed?  No    Cognitive Function:        03/14/2023    1:58 PM 03/02/2022    2:32 PM 02/19/2019   10:53 AM 02/14/2018    2:56 PM  6CIT Screen  What Year? 0 points 0 points 0 points 0 points  What month? 0 points 0 points 0 points 0 points  What time? 0 points 0 points 0 points 0 points  Count back from 20 0 points 0 points 0 points 0 points  Months in reverse 0 points 0 points 0 points 0 points  Repeat phrase 0 points 0 points 0 points 0 points  Total Score 0 points 0 points 0 points 0 points    Immunizations Immunization History  Administered Date(s) Administered   Fluad Quad(high Dose 65+) 10/27/2018   Influenza Split 10/23/2022   Influenza, High Dose Seasonal PF 10/11/2017, 11/18/2019   Influenza,inj,Quad PF,6+ Mos 10/19/2014   Influenza-Unspecified 10/11/2017, 11/08/2020, 11/15/2021   Moderna Sars-Covid-2 Vaccination 03/21/2019, 04/18/2019   Pneumococcal Conjugate-13 05/06/2017   Pneumococcal Polysaccharide-23 09/01/2018   Tdap 10/05/2010    TDAP status: Due, Education has been provided regarding the importance of this vaccine. Advised may receive this vaccine at local pharmacy or Health Dept. Aware to provide a copy of the vaccination record if obtained from local pharmacy or Health  Dept. Verbalized acceptance and understanding.  Flu Vaccine status: Up to date  Pneumococcal vaccine status: Up to date  Covid-19 vaccine status: Completed vaccines  Qualifies for Shingles Vaccine? Yes   Zostavax completed No   Shingrix Completed?: No.    Education has been provided  regarding the importance of this vaccine. Patient has been advised to call insurance company to determine out of pocket expense if they have not yet received this vaccine. Advised may also receive vaccine at local pharmacy or Health Dept. Verbalized acceptance and understanding.  Screening Tests Health Maintenance  Topic Date Due   Zoster Vaccines- Shingrix (1 of 2) Never done   DTaP/Tdap/Td (2 - Td or Tdap) 10/04/2020   COVID-19 Vaccine (3 - 2024-25 season) 09/23/2022   Colonoscopy  03/22/2023   MAMMOGRAM  11/08/2023   Medicare Annual Wellness (AWV)  03/13/2024   DEXA SCAN  06/07/2024   Pneumonia Vaccine 75+ Years old  Completed   INFLUENZA VACCINE  Completed   Hepatitis C Screening  Completed   HPV VACCINES  Aged Out    Health Maintenance  Health Maintenance Due  Topic Date Due   Zoster Vaccines- Shingrix (1 of 2) Never done   DTaP/Tdap/Td (2 - Td or Tdap) 10/04/2020   COVID-19 Vaccine (3 - 2024-25 season) 09/23/2022   Colonoscopy  03/22/2023    Colorectal cancer screening: Referral to GI placed 03/14/23. Pt aware the office will call re: appt.  Mammogram status: Completed 11/08/22. Repeat every year  Bone Density status: Completed 06/08/22. Results reflect: Bone density results: NORMAL. Repeat every 5 years.  Lung Cancer Screening: (Low Dose CT Chest recommended if Age 66-80 years, 20 pack-year currently smoking OR have quit w/in 15years.) does not qualify.    Additional Screening:  Hepatitis C Screening: does qualify; Completed 03/28/16  Vision Screening: Recommended annual ophthalmology exams for early detection of glaucoma and other disorders of the eye. Is the patient up to date with their annual eye exam?  Yes  Who is the provider or what is the name of the office in which the patient attends annual eye exams? DR.[email or web address] If pt is not established with a provider, would they like to be referred to a provider to establish care? No .   Dental Screening: Recommended  annual dental exams for proper oral hygiene   Community Resource Referral / Chronic Care Management: CRR required this visit?  No   CCM required this visit?  No     Plan:     I have personally reviewed and noted the following in the patient's chart:   Medical and social history Use of alcohol, tobacco or illicit drugs  Current medications and supplements including opioid prescriptions. Patient is not currently taking opioid prescriptions. Functional ability and status Nutritional status Physical activity Advanced directives List of other physicians Hospitalizations, surgeries, and ER visits in previous 12 months Vitals Screenings to include cognitive, depression, and falls Referrals and appointments  In addition, I have reviewed and discussed with patient certain preventive protocols, quality metrics, and best practice recommendations. A written personalized care plan for preventive services as well as general preventive health recommendations were provided to patient.     Hal Hope, LPN   1/61/0960   After Visit Summary: (MyChart) Due to this being a telephonic visit, the after visit summary with patients personalized plan was offered to patient via MyChart   Nurse Notes: REFERRAL SENT FOR COLONOSCOPY

## 2023-03-15 ENCOUNTER — Encounter: Payer: Medicare HMO | Admitting: Family Medicine

## 2023-03-26 ENCOUNTER — Encounter: Payer: Self-pay | Admitting: *Deleted

## 2023-04-10 ENCOUNTER — Encounter: Payer: Self-pay | Admitting: Family Medicine

## 2023-04-10 ENCOUNTER — Ambulatory Visit (INDEPENDENT_AMBULATORY_CARE_PROVIDER_SITE_OTHER): Payer: Medicare HMO | Admitting: Family Medicine

## 2023-04-10 VITALS — BP 120/68 | HR 70 | Resp 16 | Ht 63.0 in | Wt 186.0 lb

## 2023-04-10 DIAGNOSIS — Z1211 Encounter for screening for malignant neoplasm of colon: Secondary | ICD-10-CM | POA: Diagnosis not present

## 2023-04-10 DIAGNOSIS — Z0000 Encounter for general adult medical examination without abnormal findings: Secondary | ICD-10-CM

## 2023-04-10 DIAGNOSIS — E66811 Obesity, class 1: Secondary | ICD-10-CM

## 2023-04-10 DIAGNOSIS — Z6832 Body mass index (BMI) 32.0-32.9, adult: Secondary | ICD-10-CM

## 2023-04-10 DIAGNOSIS — Z1231 Encounter for screening mammogram for malignant neoplasm of breast: Secondary | ICD-10-CM

## 2023-04-10 DIAGNOSIS — Z23 Encounter for immunization: Secondary | ICD-10-CM

## 2023-04-10 MED ORDER — ZOSTER VAC RECOMB ADJUVANTED 50 MCG/0.5ML IM SUSR: 0.5000 mL | Freq: Once | INTRAMUSCULAR | 1 refills | Status: AC

## 2023-04-10 NOTE — Progress Notes (Unsigned)
 Patient: Desiree Carpenter, Female    DOB: February 20, 1950, 73 y.o.   MRN: [id number] Danelle Berry, PA-C Visit Date: 04/10/2023  Today's Provider: Danelle Berry, PA-C   Chief Complaint  Patient presents with   Annual Exam   Subjective:   Annual physical exam:  Desiree Carpenter is a 73 y.o. female who presents today for complete physical exam:  Exercise/Activity:  walking more  Diet/nutrition:  working on healthy Sleep:   no concerns  SDOH Screenings   Food Insecurity: No Food Insecurity (03/14/2023)  Housing: Low Risk  (03/14/2023)  Transportation Needs: No Transportation Needs (03/14/2023)  Utilities: Not At Risk (03/14/2023)  Alcohol Screen: Low Risk  (03/14/2023)  Depression (PHQ2-9): Low Risk  (04/10/2023)  Financial Resource Strain: Low Risk  (03/14/2023)  Physical Activity: Insufficiently Active (03/14/2023)  Social Connections: Socially Integrated (03/14/2023)  Stress: No Stress Concern Present (03/14/2023)  Tobacco Use: Low Risk  (04/10/2023)  Health Literacy: Adequate Health Literacy (03/14/2023)     USPSTF grade A and B recommendations - reviewed and addressed today  Depression:  Phq 9 completed today by patient, was reviewed by me with patient in the room PHQ score is neg, pt feels good    04/10/2023   10:24 AM 03/14/2023    1:55 PM 03/20/2022   10:09 AM 03/02/2022    2:23 PM  PHQ 2/9 Scores  PHQ - 2 Score 0 0 0 0  PHQ- 9 Score 0 0 0       04/10/2023   10:24 AM 03/14/2023    1:55 PM 03/20/2022   10:09 AM 03/02/2022    2:23 PM 02/23/2021   11:00 AM  Depression screen PHQ 2/9  Decreased Interest 0 0 0 0 0  Down, Depressed, Hopeless 0 0 0 0 0  PHQ - 2 Score 0 0 0 0 0  Altered sleeping 0 0 0  0  Tired, decreased energy 0 0 0  0  Change in appetite 0 0 0  0  Feeling bad or failure about yourself  0 0 0  0  Trouble concentrating 0 0 0  0  Moving slowly or fidgety/restless 0 0 0  0  Suicidal thoughts 0 0 0  0  PHQ-9 Score 0 0 0  0  Difficult doing work/chores  Not  difficult at all Not difficult at all  Not difficult at all    Alcohol screening: Flowsheet Row Clinical Support from 03/14/2023 in [facility]  AUDIT-C Score 1       Immunizations and Health Maintenance: Health Maintenance  Topic Date Due   Zoster Vaccines- Shingrix (1 of 2) Never done   DTaP/Tdap/Td (2 - Td or Tdap) 10/04/2020   COVID-19 Vaccine (3 - 2024-25 season) 09/23/2022   Colonoscopy  03/22/2023   MAMMOGRAM  11/08/2023   Medicare Annual Wellness (AWV)  04/09/2024   DEXA SCAN  06/07/2024   Pneumonia Vaccine 34+ Years old  Completed   INFLUENZA VACCINE  Completed   Hepatitis C Screening  Completed   HPV VACCINES  Aged Out     Hep C Screening: done  STD testing and prevention (HIV/chl/gon/syphilis):  see above, no additional testing desired by pt today  Intimate partner violence:  none  Sexual History/Pain during Intercourse:  Married  Menstrual History/LMP/Abnormal Bleeding: none No LMP recorded. Patient has had a hysterectomy.  Incontinence Symptoms: none  Breast cancer:  Last Mammogram: *see HM list above BRCA gene screening: ***  Cervical cancer screening: not  indicated  Pt *** family hx of cancers - breast, ovarian, uterine, colon:     Osteoporosis:   Discussion on osteoporosis per age, including high calcium and vitamin D supplementation, weight bearing exercises Pt is  supplementing with daily calcium/Vit D. Reviewed done last year normal - Bone scan/dexa Last vitamin D No results found for: "25OHVITD2", "25OHVITD3", "VD25OH"   Skin cancer:  Hx of skin CA -  NO Discussed atypical lesions   Colorectal cancer:   Colonoscopy is due she talked to  Discussed concerning signs and sx of CRC, pt denies ***  Lung cancer:   Low Dose CT Chest recommended if Age 79-80 years, 20 pack-year currently smoking OR have quit w/in 15years. Patient {DOES NOT does:27190::"does not"} qualify.    Social History   Tobacco Use   Smoking  status: Never   Smokeless tobacco: Never  Vaping Use   Vaping status: Never Used  Substance Use Topics   Alcohol use: No   Drug use: No     Flowsheet Row Clinical Support from 03/14/2023 in [facility]  AUDIT-C Score 1       Family History  Problem Relation Age of Onset   Cancer Mother        breast (50), colon (3)   Breast cancer Mother 12   Cancer Father 29       colon   Cancer Maternal Grandmother        breast   Breast cancer Maternal Grandmother 52   Heart disease Maternal Grandfather        heart failure   Cancer Maternal Grandfather        lung   Stroke Paternal Grandmother      Blood pressure/Hypertension: BP Readings from Last 3 Encounters:  04/10/23 120/68  03/20/22 122/76  02/22/21 116/62    Weight/Obesity: Wt Readings from Last 3 Encounters:  04/10/23 186 lb (84.4 kg)  03/20/22 182 lb 1.6 oz (82.6 kg)  03/02/22 185 lb (83.9 kg)   BMI Readings from Last 3 Encounters:  04/10/23 32.95 kg/m  03/20/22 32.26 kg/m  03/02/22 32.77 kg/m     Lipids:  Lab Results  Component Value Date   CHOL 145 03/20/2022   CHOL 155 02/22/2021   CHOL 136 02/23/2020   Lab Results  Component Value Date   HDL 73 03/20/2022   HDL 70 02/22/2021   HDL 62 02/23/2020   Lab Results  Component Value Date   LDLCALC 55 03/20/2022   LDLCALC 68 02/22/2021   LDLCALC 61 02/23/2020   Lab Results  Component Value Date   TRIG 88 03/20/2022   TRIG 85 02/22/2021   TRIG 58 02/23/2020   Lab Results  Component Value Date   CHOLHDL 2.0 03/20/2022   CHOLHDL 2.2 02/22/2021   CHOLHDL 2.2 02/23/2020   No results found for: "LDLDIRECT" Based on the results of lipid panel his/her cardiovascular risk factor ( using Poole Cohort )  in the next 10 years is: The 10-year ASCVD risk score (Arnett DK, et al., 2019) is: 8.8%   Values used to calculate the score:     Age: 20 years     Sex: Female     Is Non-Hispanic African American: Yes     Diabetic:  No     Tobacco smoker: No     Systolic Blood Pressure: 120 mmHg     Is BP treated: No     HDL Cholesterol: 73 mg/dL     Total Cholesterol: 145  mg/dL  Glucose:  Glucose, Bld  Date Value Ref Range Status  03/20/2022 76 65 - 99 mg/dL Final    Comment:    .            Fasting reference interval .   02/22/2021 92 65 - 99 mg/dL Final    Comment:    .            Fasting reference interval .   02/23/2020 85 65 - 99 mg/dL Final    Comment:    .            Fasting reference interval .     Advanced Care Planning:  A voluntary discussion about advance care planning including the explanation and discussion of advance directives.   Discussed health care proxy and Living will, and the patient was able to identify a health care proxy as ***.   Patient {DOES_DOES NFA:21308} have a living will at present time.   Social History       Social History   Socioeconomic History   Marital status: Married    Spouse name: Not on file   Number of children: 1   Years of education: Not on file   Highest education level: Bachelor's degree (e.g., BA, AB, BS)  Occupational History   Occupation: retired  Tobacco Use   Smoking status: Never   Smokeless tobacco: Never  Vaping Use   Vaping status: Never Used  Substance and Sexual Activity   Alcohol use: No   Drug use: No   Sexual activity: Not Currently  Other Topics Concern   Not on file  Social History Narrative   Pt has one child who lives in [place] and her mother recently came to live with her   Social Drivers of Corporate investment banker Strain: Low Risk  (03/14/2023)   Overall Financial Resource Strain (CARDIA)    Difficulty of Paying Living Expenses: Not hard at all  Food Insecurity: No Food Insecurity (03/14/2023)   Hunger Vital Sign    Worried About Running Out of Food in the Last Year: Never true    Ran Out of Food in the Last Year: Never true  Transportation Needs: No Transportation Needs (03/14/2023)   PRAPARE -  Administrator, Civil Service (Medical): No    Lack of Transportation (Non-Medical): No  Physical Activity: Insufficiently Active (03/14/2023)   Exercise Vital Sign    Days of Exercise per Week: 3 days    Minutes of Exercise per Session: 30 min  Stress: No Stress Concern Present (03/14/2023)   [employer] of Occupational Health - Occupational Stress Questionnaire    Feeling of Stress : Not at all  Social Connections: Socially Integrated (03/14/2023)   Social Connection and Isolation Panel [NHANES]    Frequency of Communication with Friends and Family: More than three times a week    Frequency of Social Gatherings with Friends and Family: Once a week    Attends Religious Services: More than 4 times per year    Active Member of [employer] or Organizations: Yes    Attends Engineer, structural: More than 4 times per year    Marital Status: Married    Family History        Family History  Problem Relation Age of Onset   Cancer Mother        breast (50), colon (74)   Breast cancer Mother 41   Cancer Father 27  colon   Cancer Maternal Grandmother        breast   Breast cancer Maternal Grandmother 52   Heart disease Maternal Grandfather        heart failure   Cancer Maternal Grandfather        lung   Stroke Paternal Grandmother     Patient Active Problem List   Diagnosis Date Noted   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   Arthritis of both knees 02/27/2018   Mild hyperlipidemia 02/27/2018   Family hx-breast malignancy 03/28/2016   Obesity 03/28/2016    Past Surgical History:  Procedure Laterality Date   ABDOMINAL HYSTERECTOMY     BUNIONECTOMY     COLONOSCOPY WITH PROPOFOL N/A 02/28/2017   Procedure: COLONOSCOPY WITH PROPOFOL;  Surgeon: Wyline Mood, MD;  Location[facility] ENDOSCOPY;  Service: Gastroenterology;  Laterality: N/A;   COLONOSCOPY WITH PROPOFOL N/A 03/21/2018   Procedure: COLONOSCOPY WITH BIOPSY;  Surgeon: Midge Minium, MD;  Location:  [facility];  Service: Endoscopy;  Laterality: N/A;     Current Outpatient Medications:    Ascorbic Acid (VITAMIN C PO), Take by mouth daily., Disp: , Rfl:    aspirin EC 81 MG tablet, Take 1 tablet (81 mg total) daily by mouth., Disp: , Rfl:    Multiple Vitamin (MULTIVITAMIN) tablet, Take 1 tablet by mouth daily., Disp: , Rfl:    pravastatin (PRAVACHOL) 20 MG tablet, Take 1 tablet (20 mg total) by mouth daily. At bedtime, Disp: 90 tablet, Rfl: 3  Allergies  Allergen Reactions   Atorvastatin     Constipation    Patient Care Team: Danelle Berry, PA-C as PCP - General (Family Medicine) Isla Pence, OD (Optometry)   Chart Review: ***  Review of Systems        Objective:   Vitals:  Vitals:   04/10/23 1024  BP: 120/68  Pulse: 70  Resp: 16  SpO2: 98%  Weight: 186 lb (84.4 kg)  Height: 5\' 3"  (1.6 m)    Body mass index is 32.95 kg/m.  Physical Exam    Fall Risk:    04/10/2023   10:23 AM 03/14/2023    1:57 PM 03/13/2023    9:21 PM 03/20/2022   10:09 AM 03/02/2022    2:21 PM  Fall Risk   Falls in the past year? 1 1 1  0 0  Number falls in past yr: 0 0 0 0 0  Injury with Fall? 0 0 0 0 0  Risk for fall due to : No Fall Risks   No Fall Risks No Fall Risks  Follow up Falls prevention discussed Falls evaluation completed;Falls prevention discussed  Falls prevention discussed;Education provided;Falls evaluation completed Education provided;Falls prevention discussed    Functional Status Survey: Is the patient deaf or have difficulty hearing?: No Does the patient have difficulty seeing, even when wearing glasses/contacts?: No Does the patient have difficulty concentrating, remembering, or making decisions?: No Does the patient have difficulty walking or climbing stairs?: No Does the patient have difficulty dressing or bathing?: No Does the patient have difficulty doing errands alone such as visiting a doctor's office or shopping?: No   Assessment & Plan:     CPE completed today  USPSTF grade A and B recommendations reviewed with patient; age-appropriate recommendations, preventive care, screening tests, etc discussed and encouraged; healthy living encouraged; see AVS for patient education given to patient  Discussed importance of 150 minutes of physical activity weekly, AHA exercise recommendations given to pt in AVS/handout  Discussed importance of  healthy diet:  eating lean meats and proteins, avoiding trans fats and saturated fats, avoid simple sugars and excessive carbs in diet, eat 6 servings of fruit/vegetables daily and drink plenty of water and avoid sweet beverages.    Recommended pt to do annual eye exam and routine dental exams/cleanings  Depression, alcohol, fall screening completed as documented above and per flowsheets  Advance Care planning information and packet discussed and offered today, encouraged pt to discuss with family members/spouse/partner/friends and complete Advanced directive packet and bring copy to office   Reviewed Health Maintenance:  Health Maintenance  Topic Date Due   Zoster Vaccines- Shingrix (1 of 2) Never done   DTaP/Tdap/Td (2 - Td or Tdap) 10/04/2020   COVID-19 Vaccine (3 - 2024-25 season) 09/23/2022   Colonoscopy  03/22/2023   MAMMOGRAM  11/08/2023   Medicare Annual Wellness (AWV)  04/09/2024   DEXA SCAN  06/07/2024   Pneumonia Vaccine 70+ Years old  Completed   INFLUENZA VACCINE  Completed   Hepatitis C Screening  Completed   HPV VACCINES  Aged Out   Immunizations: Immunization History  Administered Date(s) Administered   Fluad Quad(high Dose 65+) 10/27/2018   Influenza Split 10/23/2022   Influenza, High Dose Seasonal PF 10/11/2017, 11/18/2019   Influenza,inj,Quad PF,6+ Mos 10/19/2014   Influenza-Unspecified 10/11/2017, 11/08/2020, 11/15/2021   Moderna Sars-Covid-2 Vaccination 03/21/2019, 04/18/2019   Pneumococcal Conjugate-13 05/06/2017   Pneumococcal Polysaccharide-23 09/01/2018    Tdap 10/05/2010   Vaccines:  HPV: up to at age 59 , ask insurance if age between 75-45  Shingrix: 66-64 yo and ask insurance if covered when patient above 48 yo Pneumonia: *** educated and discussed with patient. Flu: *** educated and discussed with patient. COVID:      ICD-10-CM   1. Well adult exam  Z00.00           Danelle Berry, PA-C 04/10/23 10:29 AM  [facility]

## 2023-04-10 NOTE — Patient Instructions (Addendum)
  This is a list of the screening recommended for you and due dates:  You can get shingrix at your pharmacy Tetanus medicare pays for if you have a laceration I will help connect you with GI for your colon cancer screening follow up Mammogram do later this year. [facility] [address], [place], [place] [address] Scheduling phone #: [phone]   Health Maintenance  Topic Date Due   Zoster (Shingles) Vaccine (1 of 2) Never done   DTaP/Tdap/Td vaccine (2 - Td or Tdap) 10/04/2020   COVID-19 Vaccine (3 - 2024-25 season) 09/23/2022   Colon Cancer Screening  03/22/2023   Mammogram  11/08/2023   Medicare Annual Wellness Visit  04/09/2024   DEXA scan (bone density measurement)  06/07/2024   Pneumonia Vaccine  Completed   Flu Shot  Completed   Hepatitis C Screening  Completed   HPV Vaccine  Aged Out

## 2023-04-11 ENCOUNTER — Encounter: Payer: Self-pay | Admitting: Family Medicine

## 2023-04-11 DIAGNOSIS — E782 Mixed hyperlipidemia: Secondary | ICD-10-CM

## 2023-04-11 LAB — CBC WITH DIFFERENTIAL/PLATELET
Absolute Lymphocytes: 1362 {cells}/uL (ref 850–3900)
Absolute Monocytes: 262 {cells}/uL (ref 200–950)
Basophils Absolute: 18 {cells}/uL (ref 0–200)
Basophils Relative: 0.4 %
Eosinophils Absolute: 69 {cells}/uL (ref 15–500)
Eosinophils Relative: 1.5 %
HCT: 42.9 % (ref 35.0–45.0)
Hemoglobin: 13.5 g/dL (ref 11.7–15.5)
MCH: 27.4 pg (ref 27.0–33.0)
MCHC: 31.5 g/dL — ABNORMAL LOW (ref 32.0–36.0)
MCV: 87.2 fL (ref 80.0–100.0)
MPV: 12.3 fL (ref 7.5–12.5)
Monocytes Relative: 5.7 %
Neutro Abs: 2889 {cells}/uL (ref 1500–7800)
Neutrophils Relative %: 62.8 %
Platelets: 213 10*3/uL (ref 140–400)
RBC: 4.92 10*6/uL (ref 3.80–5.10)
RDW: 12.3 % (ref 11.0–15.0)
Total Lymphocyte: 29.6 %
WBC: 4.6 10*3/uL (ref 3.8–10.8)

## 2023-04-11 LAB — COMPLETE METABOLIC PANEL WITH GFR
AG Ratio: 1.8 (calc) (ref 1.0–2.5)
ALT: 13 U/L (ref 6–29)
AST: 16 U/L (ref 10–35)
Albumin: 4.5 g/dL (ref 3.6–5.1)
Alkaline phosphatase (APISO): 61 U/L (ref 37–153)
BUN: 20 mg/dL (ref 7–25)
CO2: 29 mmol/L (ref 20–32)
Calcium: 9.9 mg/dL (ref 8.6–10.4)
Chloride: 105 mmol/L (ref 98–110)
Creat: 0.87 mg/dL (ref 0.60–1.00)
Globulin: 2.5 g/dL (ref 1.9–3.7)
Glucose, Bld: 94 mg/dL (ref 65–99)
Potassium: 4.4 mmol/L (ref 3.5–5.3)
Sodium: 142 mmol/L (ref 135–146)
Total Bilirubin: 0.4 mg/dL (ref 0.2–1.2)
Total Protein: 7 g/dL (ref 6.1–8.1)

## 2023-04-11 LAB — LIPID PANEL
Cholesterol: 226 mg/dL — ABNORMAL HIGH (ref <200)
HDL: 77 mg/dL (ref >=50)
LDL Cholesterol (Calc): 129 mg/dL — ABNORMAL HIGH
Non-HDL Cholesterol (Calc): 149 mg/dL — ABNORMAL HIGH (ref <130)
Total CHOL/HDL Ratio: 2.9 (calc) (ref <5.0)
Triglycerides: 96 mg/dL (ref <150)

## 2023-04-15 MED ORDER — ROSUVASTATIN CALCIUM 5 MG PO TABS: 5.0000 mg | ORAL_TABLET | Freq: Every day | ORAL | 1 refills | Status: DC

## 2023-04-16 ENCOUNTER — Telehealth: Payer: Self-pay

## 2023-04-16 ENCOUNTER — Telehealth: Payer: Self-pay | Admitting: *Deleted

## 2023-04-16 ENCOUNTER — Other Ambulatory Visit: Payer: Self-pay | Admitting: *Deleted

## 2023-04-16 DIAGNOSIS — Z1211 Encounter for screening for malignant neoplasm of colon: Secondary | ICD-10-CM

## 2023-04-16 MED ORDER — NA SULFATE-K SULFATE-MG SULF 17.5-3.13-1.6 GM/177ML PO SOLN: 1.0000 | Freq: Once | ORAL | 0 refills | Status: AC

## 2023-04-16 NOTE — Telephone Encounter (Signed)
 Gastroenterology Pre-Procedure Review  Request Date: 04/30/2023 Requesting Physician: Dr. Servando Snare  PATIENT REVIEW QUESTIONS: The patient responded to the following health history questions as indicated:    1. Are you having any GI issues? no 2. Do you have a personal history of Polyps? no 3. Do you have a family history of Colon Cancer or Polyps? no 4. Diabetes Mellitus? no 5. Joint replacements in the past 12 months?no 6. Major health problems in the past 3 months?no 7. Any artificial heart valves, MVP, or defibrillator?no    MEDICATIONS & ALLERGIES:    Patient reports the following regarding taking any anticoagulation/antiplatelet therapy:   Plavix, Coumadin, Eliquis, Xarelto, Lovenox, Pradaxa, Brilinta, or Effient? no Aspirin? yes (81 mg as needed)  Patient confirms/reports the following medications:  Current Outpatient Medications  Medication Sig Dispense Refill   Ascorbic Acid (VITAMIN C PO) Take by mouth daily.     aspirin EC 81 MG tablet Take 1 tablet (81 mg total) daily by mouth.     Multiple Vitamin (MULTIVITAMIN) tablet Take 1 tablet by mouth daily.     rosuvastatin (CRESTOR) 5 MG tablet Take 1 tablet (5 mg total) by mouth daily. 90 tablet 1   No current facility-administered medications for this visit.    Patient confirms/reports the following allergies:  Allergies  Allergen Reactions   Atorvastatin     Constipation    No orders of the defined types were placed in this encounter.   AUTHORIZATION INFORMATION Primary Insurance: 1D#: Group #:  Secondary Insurance: 1D#: Group #:  SCHEDULE INFORMATION: Date: 04/30/2023 Time: Location:  [facility]

## 2023-04-16 NOTE — Telephone Encounter (Signed)
 Colonoscopy schedule with Dr Servando Snare on 04/30/2023

## 2023-04-16 NOTE — Telephone Encounter (Signed)
 Pt requesting call back to schedule colonoscopy.

## 2023-04-29 ENCOUNTER — Encounter: Payer: Self-pay | Admitting: Gastroenterology

## 2023-04-30 ENCOUNTER — Encounter
Admission: RE | Disposition: A | Discharge status: Home / Self Care | Payer: Self-pay | Source: Home / Self Care | Attending: Gastroenterology

## 2023-04-30 ENCOUNTER — Ambulatory Visit: Admitting: Anesthesiology

## 2023-04-30 ENCOUNTER — Ambulatory Visit
Admission: RE | Admit: 2023-04-30 | Discharge: 2023-04-30 | Disposition: A | Discharge status: Home / Self Care | Attending: Gastroenterology | Admitting: Gastroenterology

## 2023-04-30 ENCOUNTER — Encounter: Payer: Self-pay | Admitting: Gastroenterology

## 2023-04-30 DIAGNOSIS — Z1211 Encounter for screening for malignant neoplasm of colon: Secondary | ICD-10-CM | POA: Diagnosis not present

## 2023-04-30 DIAGNOSIS — D122 Benign neoplasm of ascending colon: Secondary | ICD-10-CM | POA: Diagnosis not present

## 2023-04-30 DIAGNOSIS — K635 Polyp of colon: Secondary | ICD-10-CM | POA: Diagnosis not present

## 2023-04-30 DIAGNOSIS — Z800 Family history of malignant neoplasm of digestive organs: Secondary | ICD-10-CM | POA: Insufficient documentation

## 2023-04-30 HISTORY — PX: COLONOSCOPY: SHX5424

## 2023-04-30 HISTORY — PX: POLYPECTOMY: SHX149

## 2023-04-30 SURGERY — COLONOSCOPY
Anesthesia: General

## 2023-04-30 MED ORDER — PROPOFOL 10 MG/ML IV BOLUS
INTRAVENOUS | Status: DC | PRN
  Administered 2023-04-30: 60 mg via INTRAVENOUS

## 2023-04-30 MED ORDER — LIDOCAINE HCL (CARDIAC) PF 100 MG/5ML IV SOSY
PREFILLED_SYRINGE | INTRAVENOUS | Status: DC | PRN
  Administered 2023-04-30: 60 mg via INTRAVENOUS

## 2023-04-30 MED ORDER — PROPOFOL 500 MG/50ML IV EMUL
INTRAVENOUS | Status: DC | PRN
  Administered 2023-04-30: 150 ug/kg/min via INTRAVENOUS

## 2023-04-30 MED ORDER — SODIUM CHLORIDE 0.9 % IV SOLN: INTRAVENOUS | Status: DC

## 2023-04-30 NOTE — H&P (Signed)
 Midge Minium, MD [facility] [address], [address] [place], [place] [address] Phone:[phone] Fax : [phone]  Primary Care Physician:  Danelle Berry, PA-C Primary Gastroenterologist:  Dr. Servando Snare  Pre-Procedure History & Physical: HPI:  JALAILA CARADONNA is a 73 y.o. female is here for an colonoscopy.   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   Mild hyperlipidemia 02/27/2018   Obesity 03/28/2016    Past Surgical History:  Procedure Laterality Date   ABDOMINAL HYSTERECTOMY     BUNIONECTOMY     COLONOSCOPY WITH PROPOFOL N/A 02/28/2017   Procedure: COLONOSCOPY WITH PROPOFOL;  Surgeon: Wyline Mood, MD;  Location: [facility] ENDOSCOPY;  Service: Gastroenterology;  Laterality: N/A;   COLONOSCOPY WITH PROPOFOL N/A 03/21/2018   Procedure: COLONOSCOPY WITH BIOPSY;  Surgeon: Midge Minium, MD;  Location: [facility];  Service: Endoscopy;  Laterality: N/A;    Prior to Admission medications   Medication Sig Start Date End Date Taking? Authorizing Provider  Ascorbic Acid (VITAMIN C PO) Take by mouth daily.    [provider]  aspirin EC 81 MG tablet Take 1 tablet (81 mg total) daily by mouth. 11/29/16   Kerman Passey, MD  Multiple Vitamin (MULTIVITAMIN) tablet Take 1 tablet by mouth daily.    [provider]  rosuvastatin (CRESTOR) 5 MG tablet Take 1 tablet (5 mg total) by mouth daily. 04/15/23   Danelle Berry, PA-C    Allergies as of 04/16/2023 - Review Complete 04/10/2023  Allergen Reaction Noted   Atorvastatin  05/09/2018    Family History  Problem Relation Age of Onset   Cancer Mother        breast (50), colon (80)   Breast cancer Mother 38   Cancer Father 28       colon   Cancer Maternal Grandmother        breast   Breast cancer Maternal Grandmother 52   Heart disease Maternal Grandfather        heart failure   Cancer Maternal Grandfather         lung   Stroke Paternal Grandmother     Social History   Socioeconomic History   Marital status: Married    Spouse name: Not on file   Number of children: 1   Years of education: Not on file   Highest education level: Bachelor's degree (e.g., BA, AB, BS)  Occupational History   Occupation: retired  Tobacco Use   Smoking status: Never   Smokeless tobacco: Never  Vaping Use   Vaping status: Never Used  Substance and Sexual Activity   Alcohol use: No   Drug use: No   Sexual activity: Not Currently  Other Topics Concern   Not on file  Social History Narrative   Pt has one child who lives in [place] and her mother recently came to live with her   Social Drivers of Corporate investment banker Strain: Low Risk  (03/14/2023)   Overall Financial Resource Strain (CARDIA)    Difficulty of Paying Living Expenses: Not hard at all  Food Insecurity: No Food Insecurity (03/14/2023)   Hunger Vital Sign    Worried About Running Out of Food in the Last Year: Never true    Ran Out of Food in the Last Year: Never true  Transportation Needs: No Transportation Needs (03/14/2023)   PRAPARE - Transportation    Lack of Transportation (  Medical): No    Lack of Transportation (Non-Medical): No  Physical Activity: Insufficiently Active (03/14/2023)   Exercise Vital Sign    Days of Exercise per Week: 3 days    Minutes of Exercise per Session: 30 min  Stress: No Stress Concern Present (03/14/2023)   [employer] of Occupational Health - Occupational Stress Questionnaire    Feeling of Stress : Not at all  Social Connections: Socially Integrated (03/14/2023)   Social Connection and Isolation Panel [NHANES]    Frequency of Communication with Friends and Family: More than three times a week    Frequency of Social Gatherings with Friends and Family: Once a week    Attends Religious Services: More than 4 times per year    Active Member of [employer] or Organizations: Yes    Attends Hospital doctor: More than 4 times per year    Marital Status: Married  Catering manager Violence: Not At Risk (03/14/2023)   Humiliation, Afraid, Rape, and Kick questionnaire    Fear of Current or Ex-Partner: No    Emotionally Abused: No    Physically Abused: No    Sexually Abused: No    Review of Systems: See HPI, otherwise negative ROS  Physical Exam: BP 132/79   Pulse (!) 53   Temp (!) 96.3 F (35.7 C) (Temporal)   Resp 16   Ht 5\' 3"  (1.6 m)   Wt 80.7 kg   SpO2 100%   BMI 31.53 kg/m  General:   Alert,  pleasant and cooperative in NAD Head:  Normocephalic and atraumatic. Neck:  Supple; no masses or thyromegaly. Lungs:  Clear throughout to auscultation.    Heart:  Regular rate and rhythm. Abdomen:  Soft, nontender and nondistended. Normal bowel sounds, without guarding, and without rebound.   Neurologic:  Alert and  oriented x4;  grossly normal neurologically.  Impression/Plan: NYIESHA BEEVER is here for an colonoscopy to be performed for family history of colon cancer  Risks, benefits, limitations, and alternatives regarding  colonoscopy have been reviewed with the patient.  Questions have been answered.  All parties agreeable.   Midge Minium, MD  04/30/2023, 10:24 AM

## 2023-04-30 NOTE — Anesthesia Postprocedure Evaluation (Signed)
 Anesthesia Post Note  Patient: Desiree Carpenter  Procedure(s) Performed: COLONOSCOPY  Patient location during evaluation: Endoscopy Anesthesia Type: General Level of consciousness: awake and alert Pain management: pain level controlled Vital Signs Assessment: post-procedure vital signs reviewed and stable Respiratory status: spontaneous breathing, nonlabored ventilation, respiratory function stable and patient connected to nasal cannula oxygen Cardiovascular status: blood pressure returned to baseline and stable Postop Assessment: no apparent nausea or vomiting Anesthetic complications: no   No notable events documented.   Last Vitals:  Vitals:   04/30/23 1036 04/30/23 1046  BP:  (!) 141/79  Pulse: (!) 55 (!) 54  Resp:    Temp:    SpO2:  100%    Last Pain:  Vitals:   04/30/23 1046  TempSrc:   PainSc: 0-No pain                 Cleda Mccreedy Elyna Pangilinan

## 2023-04-30 NOTE — Op Note (Signed)
 [facility] Patient Name: Desiree Carpenter Procedure Date: 04/30/2023 9:57 AM MRN: [id number] Account #: [id number] Date of Birth: 1950-10-06 Admit Type: Outpatient Age: 73 Room: [facility] ENDO ROOM 4 Gender: Female Note Status: Finalized Instrument Name: Prentice Docker [id number] Procedure:             Colonoscopy Indications:           Screening in patient at increased risk: Family history                         of 1st-degree relative with colorectal cancer Providers:             Midge Minium MD, MD Referring MD:          Danelle Berry (Referring MD) Medicines:             Propofol per Anesthesia Complications:         No immediate complications. Procedure:             Pre-Anesthesia Assessment:                        - Prior to the procedure, a History and Physical was                         performed, and patient medications and allergies were                         reviewed. The patient's tolerance of previous                         anesthesia was also reviewed. The risks and benefits                         of the procedure and the sedation options and risks                         were discussed with the patient. All questions were                         answered, and informed consent was obtained. Prior                         Anticoagulants: The patient has taken no anticoagulant                         or antiplatelet agents. ASA Grade Assessment: II - A                         patient with mild systemic disease. After reviewing                         the risks and benefits, the patient was deemed in                         satisfactory condition to undergo the procedure.                        After obtaining informed consent, the colonoscope was  passed under direct vision. Throughout the procedure,                         the patient's blood pressure, pulse, and oxygen                         saturations were monitored  continuously. The                         Colonoscope was introduced through the anus and                         advanced to the the cecum, identified by appendiceal                         orifice and ileocecal valve. The colonoscopy was                         performed without difficulty. The patient tolerated                         the procedure well. The quality of the bowel                         preparation was excellent. Findings:      The perianal and digital rectal examinations were normal.      Three sessile polyps were found in the ascending colon. The polyps were       2 to 5 mm in size. These polyps were removed with a cold snare.       Resection and retrieval were complete. Impression:            - Three 2 to 5 mm polyps in the ascending colon,                         removed with a cold snare. Resected and retrieved. Recommendation:        - Discharge patient to home.                        - Resume previous diet.                        - Continue present medications.                        - Await pathology results.                        - Repeat colonoscopy in 5 years for surveillance. Procedure Code(s):     --- Professional ---                        [phone], Colonoscopy, flexible; with removal of                         tumor(s), polyp(s), or other lesion(s) by snare                         technique Diagnosis Code(s):     --- Professional ---  Z80.0, Family history of malignant neoplasm of                         digestive organs                        D12.2, Benign neoplasm of ascending colon CPT copyright 2022 [employer]. All rights reserved. The codes documented in this report are preliminary and upon coder review may  be revised to meet current compliance requirements. Midge Minium MD, MD 04/30/2023 10:23:25 AM This report has been signed electronically. Number of Addenda: 0 Note Initiated On: 04/30/2023 9:57 AM Scope  Withdrawal Time: 0 hours 7 minutes 15 seconds  Total Procedure Duration: 0 hours 13 minutes 0 seconds  Estimated Blood Loss:  Estimated blood loss: none.      [facility]

## 2023-04-30 NOTE — Transfer of Care (Signed)
 Immediate Anesthesia Transfer of Care Note  Patient: Desiree Carpenter  Procedure(s) Performed: COLONOSCOPY  Patient Location: Endoscopy Unit  Anesthesia Type:General  Level of Consciousness: drowsy and patient cooperative  Airway & Oxygen Therapy: Patient Spontanous Breathing  Post-op Assessment: Report given to RN, Post -op Vital signs reviewed and stable, and Patient moving all extremities X 4  Post vital signs: Reviewed and stable  Last Vitals:  Vitals Value Taken Time  BP    Temp    Pulse 66 04/30/23 1026  Resp 14 04/30/23 1026  SpO2 100 % 04/30/23 1026  Vitals shown include unfiled device data.  Last Pain:  Vitals:   04/30/23 0937  TempSrc: Temporal  PainSc: 0-No pain         Complications: No notable events documented.

## 2023-04-30 NOTE — Anesthesia Preprocedure Evaluation (Signed)
 Anesthesia Evaluation  Patient identified by MRN, date of birth, ID band Patient awake    Reviewed: Allergy & Precautions, NPO status , Patient's Chart, lab work & pertinent test results  History of Anesthesia Complications Negative for: history of anesthetic complications  Airway Mallampati: III  TM Distance: <3 FB Neck ROM: full    Dental  (+) Chipped   Pulmonary neg pulmonary ROS, neg shortness of breath   Pulmonary exam normal        Cardiovascular (-) angina negative cardio ROS Normal cardiovascular exam     Neuro/Psych negative neurological ROS  negative psych ROS   GI/Hepatic negative GI ROS, Neg liver ROS,neg GERD  ,,  Endo/Other  negative endocrine ROS    Renal/GU negative Renal ROS  negative genitourinary   Musculoskeletal   Abdominal   Peds  Hematology negative hematology ROS (+)   Anesthesia Other Findings Past Medical History: 02/27/2018: Arthritis of both knees No date: Encounter for screening for HIV 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/27/2018: Mild hyperlipidemia 03/28/2016: Obesity  Past Surgical History: No date: ABDOMINAL HYSTERECTOMY No date: BUNIONECTOMY 02/28/2017: COLONOSCOPY WITH PROPOFOL; N/A     Comment:  Procedure: COLONOSCOPY WITH PROPOFOL;  Surgeon: Wyline Mood, MD;  Location: [facility] ENDOSCOPY;  Service:               Gastroenterology;  Laterality: N/A; 03/21/2018: COLONOSCOPY WITH PROPOFOL; N/A     Comment:  Procedure: COLONOSCOPY WITH BIOPSY;  Surgeon: Midge Minium, MD;  Location: [facility];  Service:               Endoscopy;  Laterality: N/A;  BMI    Body Mass Index: 31.53 kg/m      Reproductive/Obstetrics negative OB ROS                             Anesthesia Physical Anesthesia Plan  ASA: 2  Anesthesia Plan: General   Post-op Pain Management:    Induction: Intravenous  PONV Risk Score and Plan:  Propofol infusion and TIVA  Airway Management Planned: Natural Airway and Nasal Cannula  Additional Equipment:   Intra-op Plan:   Post-operative Plan:   Informed Consent: I have reviewed the patients History and Physical, chart, labs and discussed the procedure including the risks, benefits and alternatives for the proposed anesthesia with the patient or authorized representative who has indicated his/her understanding and acceptance.     Dental Advisory Given  Plan Discussed with: Anesthesiologist, CRNA and Surgeon  Anesthesia Plan Comments: (Patient consented for risks of anesthesia including but not limited to:  - adverse reactions to medications - risk of airway placement if required - damage to eyes, teeth, lips or other oral mucosa - nerve damage due to positioning  - sore throat or hoarseness - Damage to heart, brain, nerves, lungs, other parts of body or loss of life  Patient voiced understanding and assent.)       Anesthesia Quick Evaluation

## 2023-05-01 ENCOUNTER — Encounter: Payer: Self-pay | Admitting: Gastroenterology

## 2023-05-01 LAB — SURGICAL PATHOLOGY

## 2023-07-30 DIAGNOSIS — Z0100 Encounter for examination of eyes and vision without abnormal findings: Secondary | ICD-10-CM | POA: Diagnosis not present

## 2023-07-30 DIAGNOSIS — H1045 Other chronic allergic conjunctivitis: Secondary | ICD-10-CM | POA: Diagnosis not present

## 2023-07-30 DIAGNOSIS — H2513 Age-related nuclear cataract, bilateral: Secondary | ICD-10-CM | POA: Diagnosis not present

## 2023-10-09 ENCOUNTER — Other Ambulatory Visit: Payer: Self-pay | Admitting: Family Medicine

## 2023-10-09 DIAGNOSIS — E782 Mixed hyperlipidemia: Secondary | ICD-10-CM

## 2023-10-10 NOTE — Telephone Encounter (Signed)
 Requested Prescriptions  Pending Prescriptions Disp Refills   rosuvastatin  (CRESTOR ) 5 MG tablet [Pharmacy Med Name: ROSUVASTATIN  CALCIUM  5 MG TAB] 90 tablet 1    Sig: TAKE 1 TABLET (5 MG TOTAL) BY MOUTH DAILY.     Cardiovascular:  Antilipid - Statins 2 Failed - 10/10/2023 11:24 AM      Failed - Lipid Panel in normal range within the last 12 months    Cholesterol, Total  Date Value Ref Range Status  11/29/2014 199 100 - 199 mg/dL Final   Cholesterol  Date Value Ref Range Status  04/10/2023 226 (H) <200 mg/dL Final   LDL Cholesterol (Calc)  Date Value Ref Range Status  04/10/2023 129 (H) mg/dL (calc) Final    Comment:    Reference range: <100 . Desirable range <100 mg/dL for primary prevention;   <70 mg/dL for patients with CHD or diabetic patients  with > or = 2 CHD risk factors. SABRA LDL-C is now calculated using the Martin-Hopkins  calculation, which is a validated novel method providing  better accuracy than the Friedewald equation in the  estimation of LDL-C.  Gladis APPLETHWAITE et al. SANDREA. 7986;689(80): 2061-2068  (http://education.[employer].com/faq/FAQ164)    HDL  Date Value Ref Range Status  04/10/2023 77 > OR = 50 mg/dL Final  88/92/7983 80 >60 mg/dL Final    Comment:    According to ATP-III Guidelines, HDL-C >59 mg/dL is considered a negative risk factor for CHD.    Triglycerides  Date Value Ref Range Status  04/10/2023 96 <150 mg/dL Final         Passed - Cr in normal range and within 360 days    Creat  Date Value Ref Range Status  04/10/2023 0.87 0.60 - 1.00 mg/dL Final         Passed - Patient is not pregnant      Passed - Valid encounter within last 12 months    Recent Outpatient Visits           6 months ago Well adult exam   [facility] Leavy Mole, PA-C       Future Appointments             In 6 months Leavy Mole, PA-C [facility], [place]

## 2023-10-23 ENCOUNTER — Ambulatory Visit (INDEPENDENT_AMBULATORY_CARE_PROVIDER_SITE_OTHER): Admitting: Emergency Medicine

## 2023-10-23 DIAGNOSIS — Z23 Encounter for immunization: Secondary | ICD-10-CM

## 2023-11-20 ENCOUNTER — Other Ambulatory Visit: Payer: Self-pay | Admitting: Internal Medicine

## 2023-11-20 ENCOUNTER — Ambulatory Visit (INDEPENDENT_AMBULATORY_CARE_PROVIDER_SITE_OTHER)

## 2023-11-20 DIAGNOSIS — Z23 Encounter for immunization: Secondary | ICD-10-CM | POA: Diagnosis not present

## 2023-12-02 ENCOUNTER — Ambulatory Visit
Admission: RE | Admit: 2023-12-02 | Discharge: 2023-12-02 | Disposition: A | Discharge status: Home / Self Care | Source: Ambulatory Visit | Attending: Family Medicine | Admitting: Family Medicine

## 2023-12-02 DIAGNOSIS — Z1231 Encounter for screening mammogram for malignant neoplasm of breast: Secondary | ICD-10-CM | POA: Insufficient documentation

## 2024-03-19 ENCOUNTER — Ambulatory Visit: Payer: Medicare HMO

## 2024-04-10 ENCOUNTER — Encounter: Admitting: Internal Medicine

## 2024-04-10 ENCOUNTER — Ambulatory Visit: Admitting: Family Medicine
